# Patient Record
Sex: Female | Born: 1970 | Race: White | Hispanic: No | Marital: Married | Smoking: Current every day smoker
Health system: Southern US, Community
[De-identification: ages and names within clinical notes are randomized; demographics above are authoritative.]

## PROBLEM LIST (undated history)

## (undated) DIAGNOSIS — E663 Overweight: Secondary | ICD-10-CM

## (undated) DIAGNOSIS — F32A Depression, unspecified: Secondary | ICD-10-CM

## (undated) DIAGNOSIS — Z720 Tobacco use: Secondary | ICD-10-CM

## (undated) DIAGNOSIS — R809 Proteinuria, unspecified: Secondary | ICD-10-CM

## (undated) DIAGNOSIS — E041 Nontoxic single thyroid nodule: Secondary | ICD-10-CM

## (undated) DIAGNOSIS — R569 Unspecified convulsions: Secondary | ICD-10-CM

## (undated) DIAGNOSIS — E1140 Type 2 diabetes mellitus with diabetic neuropathy, unspecified: Secondary | ICD-10-CM

## (undated) DIAGNOSIS — I10 Essential (primary) hypertension: Secondary | ICD-10-CM

## (undated) DIAGNOSIS — E785 Hyperlipidemia, unspecified: Secondary | ICD-10-CM

## (undated) DIAGNOSIS — E119 Type 2 diabetes mellitus without complications: Secondary | ICD-10-CM

## (undated) DIAGNOSIS — F329 Major depressive disorder, single episode, unspecified: Secondary | ICD-10-CM

## (undated) DIAGNOSIS — I517 Cardiomegaly: Secondary | ICD-10-CM

## (undated) HISTORY — DX: Major depressive disorder, single episode, unspecified: F32.9

## (undated) HISTORY — PX: TUBAL LIGATION: SHX77

## (undated) HISTORY — DX: Overweight: E66.3

## (undated) HISTORY — DX: Type 2 diabetes mellitus with diabetic neuropathy, unspecified: E11.40

## (undated) HISTORY — DX: Essential (primary) hypertension: I10

## (undated) HISTORY — DX: Hyperlipidemia, unspecified: E78.5

## (undated) HISTORY — DX: Type 2 diabetes mellitus without complications: E11.9

## (undated) HISTORY — DX: Tobacco use: Z72.0

## (undated) HISTORY — DX: Proteinuria, unspecified: R80.9

## (undated) HISTORY — DX: Cardiomegaly: I51.7

## (undated) HISTORY — DX: Nontoxic single thyroid nodule: E04.1

## (undated) HISTORY — DX: Depression, unspecified: F32.A

---

## 2004-10-01 ENCOUNTER — Ambulatory Visit: Payer: Self-pay | Admitting: Psychiatry

## 2004-10-01 ENCOUNTER — Inpatient Hospital Stay (HOSPITAL_COMMUNITY): Admission: RE | Admit: 2004-10-01 | Discharge: 2004-10-06 | Payer: Self-pay | Admitting: Psychiatry

## 2004-12-09 HISTORY — PX: CARDIAC CATHETERIZATION: SHX172

## 2005-05-27 ENCOUNTER — Ambulatory Visit: Payer: Self-pay

## 2005-06-28 ENCOUNTER — Ambulatory Visit: Payer: Self-pay | Admitting: Internal Medicine

## 2006-03-27 ENCOUNTER — Encounter: Payer: Self-pay | Admitting: Rheumatology

## 2006-04-08 ENCOUNTER — Encounter: Payer: Self-pay | Admitting: Rheumatology

## 2006-05-09 ENCOUNTER — Encounter: Payer: Self-pay | Admitting: Rheumatology

## 2007-01-13 ENCOUNTER — Emergency Department: Payer: Self-pay | Admitting: Emergency Medicine

## 2008-12-01 ENCOUNTER — Inpatient Hospital Stay: Payer: Self-pay | Admitting: Internal Medicine

## 2011-04-02 ENCOUNTER — Ambulatory Visit: Payer: Self-pay | Admitting: Nephrology

## 2011-04-02 ENCOUNTER — Emergency Department: Payer: Self-pay | Admitting: Emergency Medicine

## 2011-12-20 ENCOUNTER — Ambulatory Visit: Payer: Self-pay | Admitting: Family Medicine

## 2012-04-02 ENCOUNTER — Emergency Department: Payer: Self-pay | Admitting: Emergency Medicine

## 2012-04-02 LAB — URINALYSIS, COMPLETE
Bacteria: NONE SEEN
Leukocyte Esterase: NEGATIVE
Nitrite: NEGATIVE
Ph: 5 (ref 4.5–8.0)
Protein: NEGATIVE

## 2012-04-02 LAB — CBC
HCT: 43.2 % (ref 35.0–47.0)
HGB: 13.8 g/dL (ref 12.0–16.0)
MCH: 29.5 pg (ref 26.0–34.0)
MCHC: 31.8 g/dL — ABNORMAL LOW (ref 32.0–36.0)
MCV: 93 fL (ref 80–100)
RBC: 4.66 10*6/uL (ref 3.80–5.20)

## 2012-04-02 LAB — COMPREHENSIVE METABOLIC PANEL
Alkaline Phosphatase: 109 U/L (ref 50–136)
Anion Gap: 13 (ref 7–16)
BUN: 17 mg/dL (ref 7–18)
Bilirubin,Total: 0.5 mg/dL (ref 0.2–1.0)
Calcium, Total: 8.6 mg/dL (ref 8.5–10.1)
Chloride: 101 mmol/L (ref 98–107)
Co2: 18 mmol/L — ABNORMAL LOW (ref 21–32)
Creatinine: 1.08 mg/dL (ref 0.60–1.30)
EGFR (Non-African Amer.): >60
Potassium: 3.5 mmol/L (ref 3.5–5.1)
SGOT(AST): 17 U/L (ref 15–37)
SGPT (ALT): 21 U/L
Sodium: 132 mmol/L — ABNORMAL LOW (ref 136–145)
Total Protein: 7.4 g/dL (ref 6.4–8.2)

## 2012-04-02 LAB — TROPONIN I: Troponin-I: <0.02 ng/mL

## 2012-04-02 LAB — PREGNANCY, URINE: Pregnancy Test, Urine: NEGATIVE m[IU]/mL

## 2012-09-08 ENCOUNTER — Ambulatory Visit: Payer: Self-pay

## 2013-05-17 IMAGING — CR DG ABDOMEN 3V
1 series · 4 of 4 positions shown · non-contrast
Comparison: none

REASON FOR EXAM: vomiting
COMMENTS:

PROCEDURE:     DXR - DXR ABDOMEN 3-WAY (INCL PA CXR)  - April 02, 2012  [DATE]
RESULT:     Comparison: Chest radiograph 04/02/2011

[Series 1: pa · 0.17mm/px · 4 of 4 slices shown]
[im 1/4]
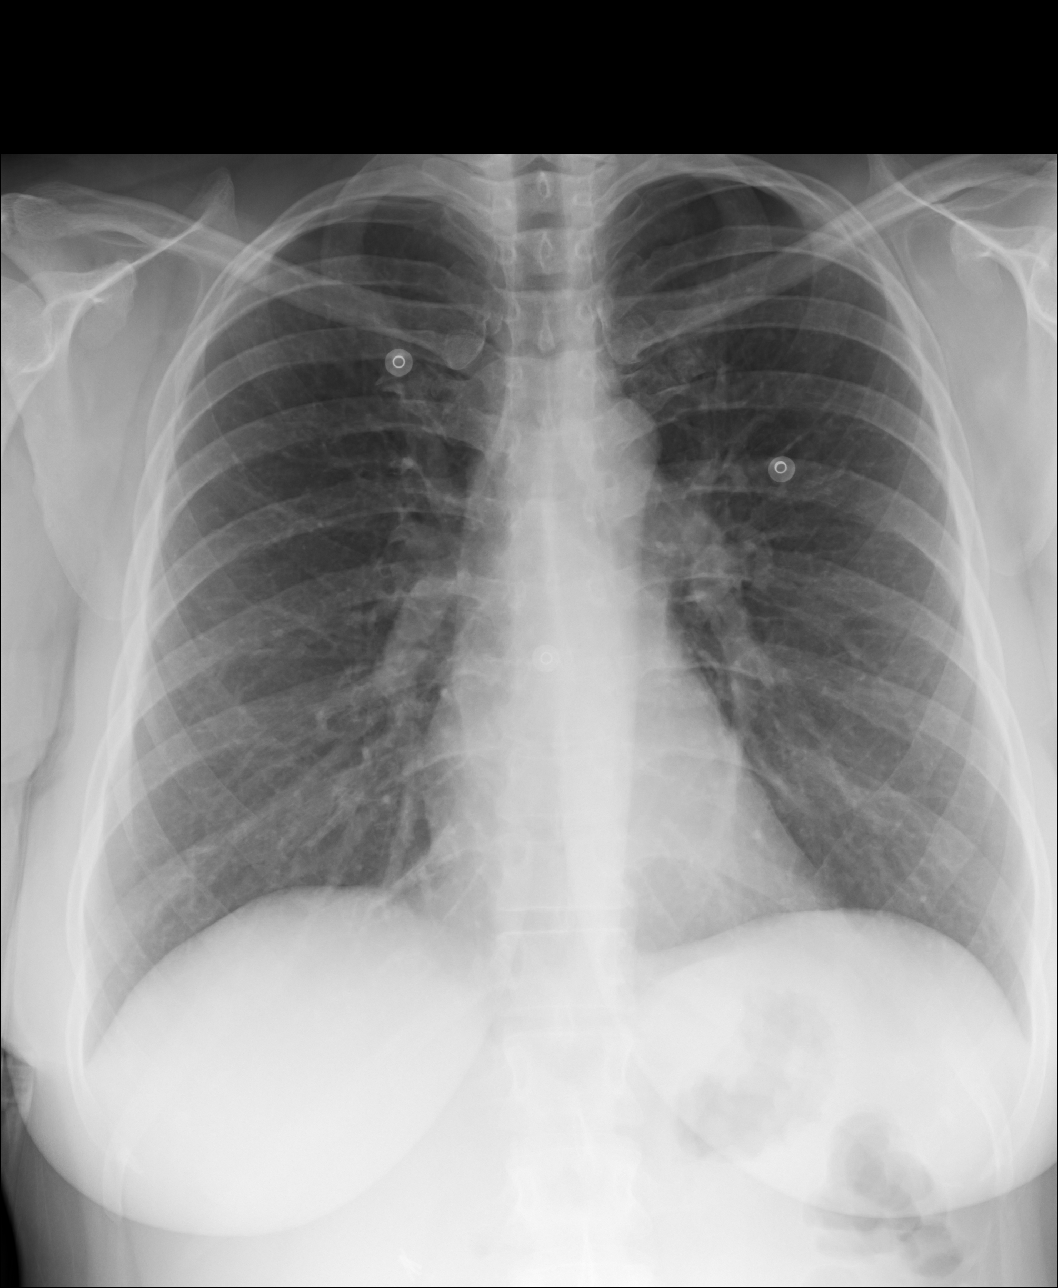
[im 2/4]
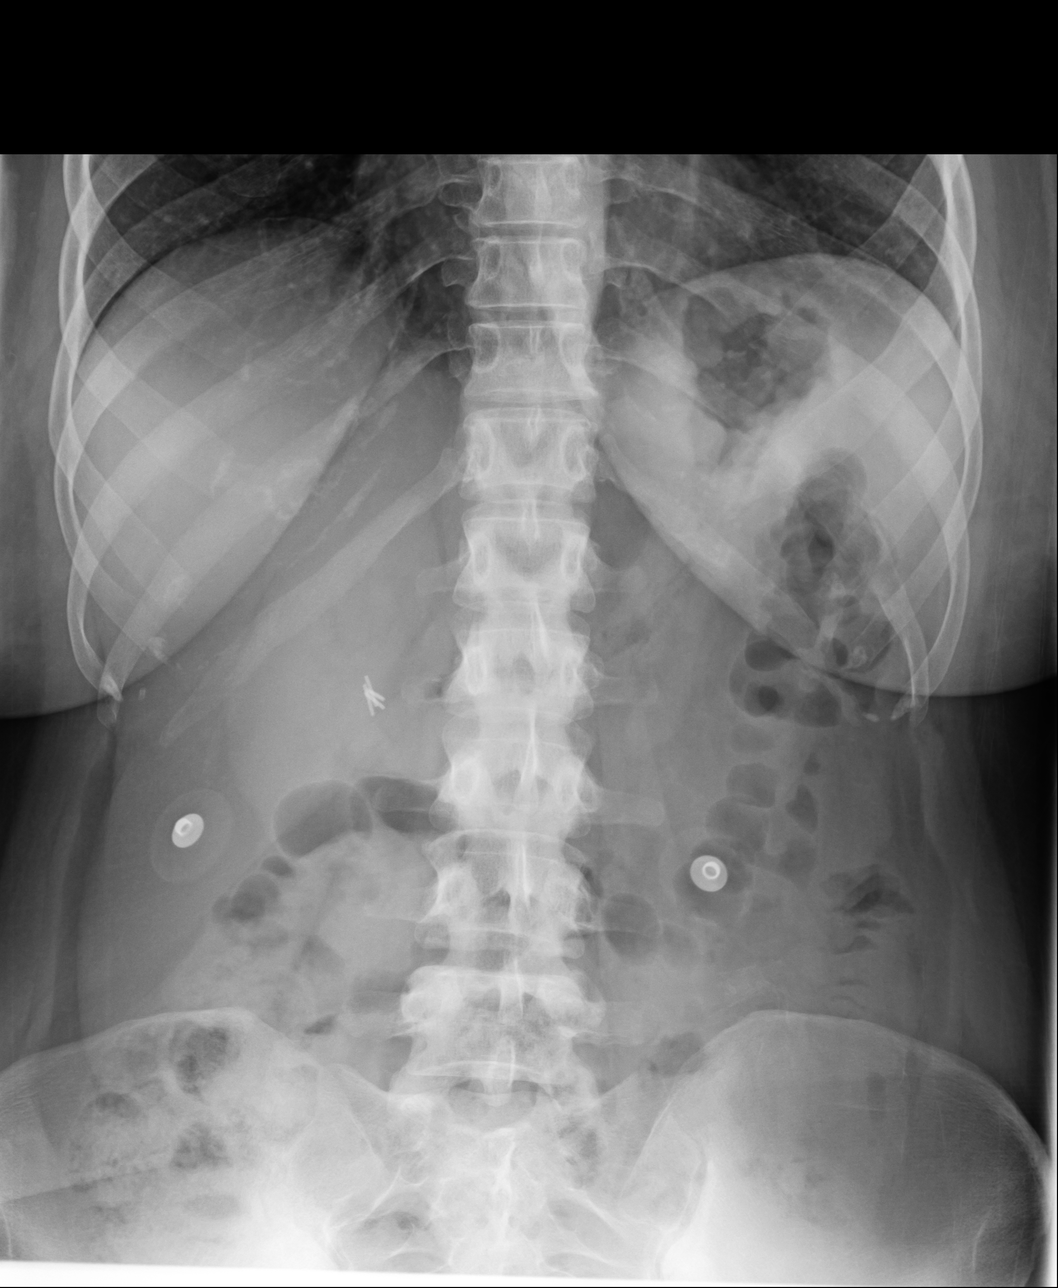
[im 3/4]
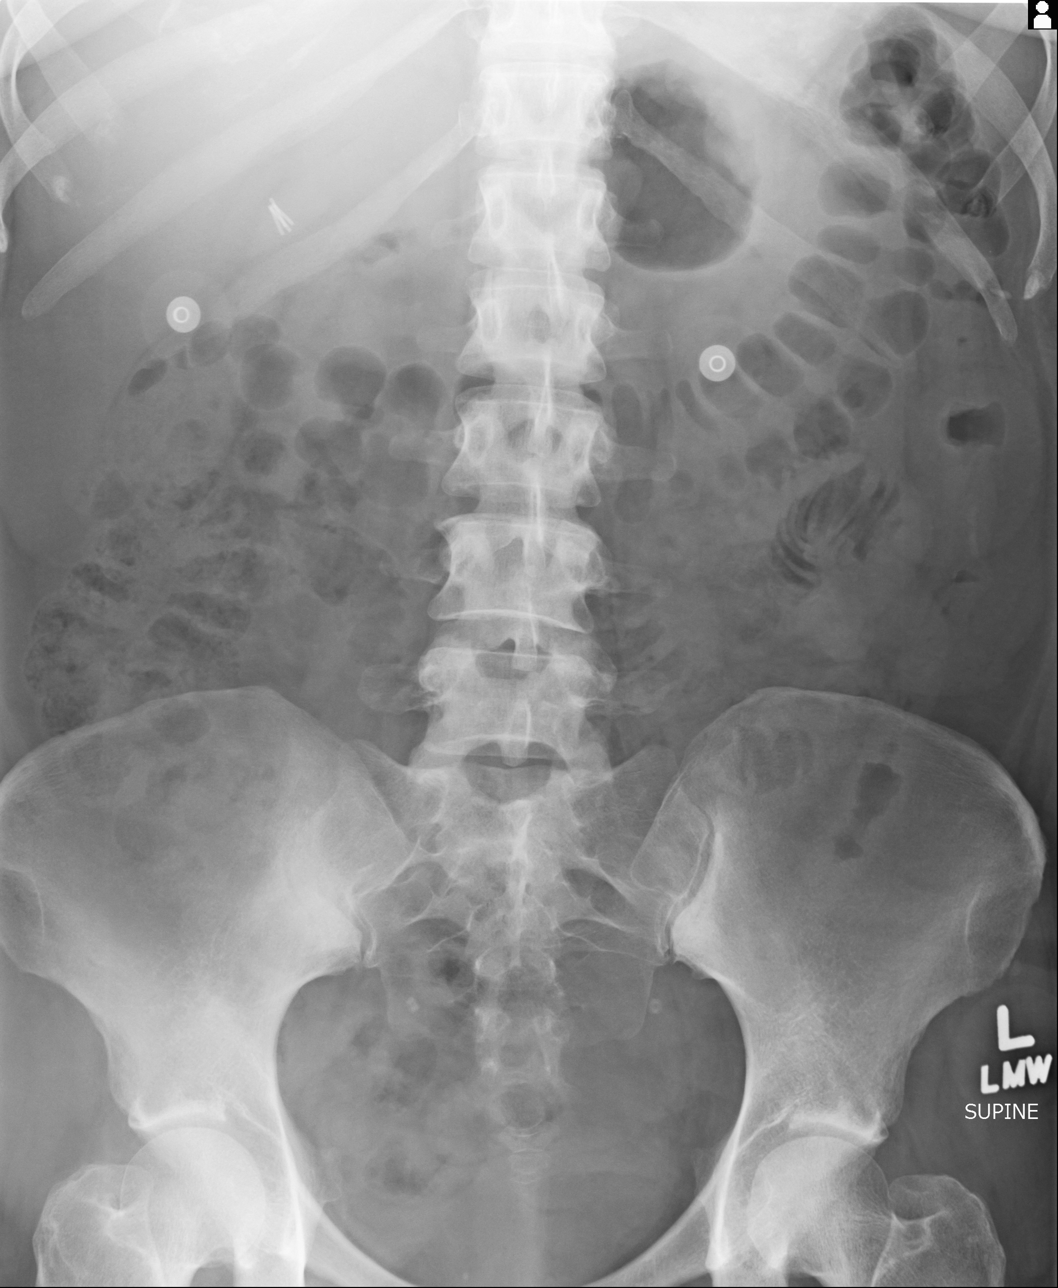
[im 4/4]
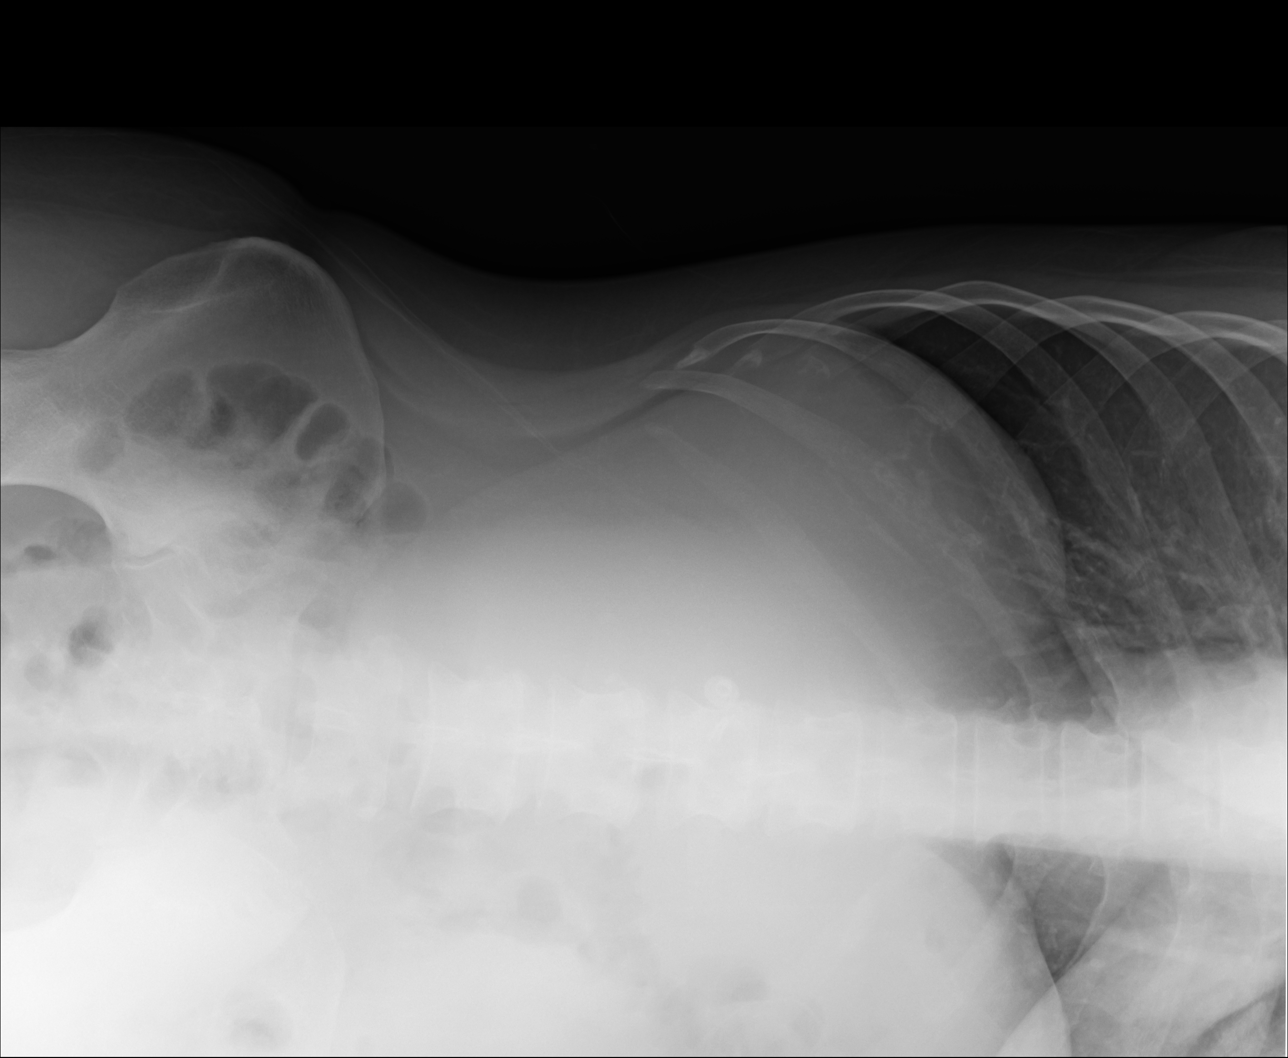

[4 of 4 positions shown; findings below may reference images not displayed]

FINDINGS: The heart and mediastinum are stable. No focal pulmonary opacities.

No free intraperitoneal air. Air is seen within nondilated small and large
bowel. Surgical clips are seen from prior cholecystectomy.
IMPRESSION: Nonobstructed bowel gas pattern.

## 2015-07-20 ENCOUNTER — Other Ambulatory Visit: Payer: Self-pay | Admitting: Family Medicine

## 2015-07-20 NOTE — Telephone Encounter (Signed)
Pt stated she needs the following:  Metformin  Pharm is [facility] on [address] in [place].  Thanks.

## 2015-07-20 NOTE — Telephone Encounter (Signed)
Routing confirmation to provider. Only needs Metformin refilled.

## 2015-07-20 NOTE — Telephone Encounter (Signed)
I attempted to call patient to see which medication(s) she needed refilled. Left a message to call. But looking at her medications, it looks like Metformin is the only one that does not have refills.

## 2015-07-20 NOTE — Telephone Encounter (Signed)
Pt called in to get refill on medication and schedule appt since it had been a while. She would like to have her diabetes medication refilled because she is completely out(send to [facility] [address]). She has scheduled an appt to come in aug 17th.

## 2015-07-21 NOTE — Telephone Encounter (Signed)
Called and left message on patients cell phone number of Dr.Ladas response.

## 2015-07-21 NOTE — Telephone Encounter (Signed)
Patient needs to get this from Dr. Tedd Sias, her diabetes specialist

## 2015-07-25 DIAGNOSIS — F32A Depression, unspecified: Secondary | ICD-10-CM | POA: Insufficient documentation

## 2015-07-25 DIAGNOSIS — I10 Essential (primary) hypertension: Secondary | ICD-10-CM | POA: Insufficient documentation

## 2015-07-25 DIAGNOSIS — E041 Nontoxic single thyroid nodule: Secondary | ICD-10-CM | POA: Insufficient documentation

## 2015-07-25 DIAGNOSIS — E119 Type 2 diabetes mellitus without complications: Secondary | ICD-10-CM | POA: Insufficient documentation

## 2015-07-25 DIAGNOSIS — R809 Proteinuria, unspecified: Secondary | ICD-10-CM | POA: Insufficient documentation

## 2015-07-25 DIAGNOSIS — Z720 Tobacco use: Secondary | ICD-10-CM | POA: Insufficient documentation

## 2015-07-25 DIAGNOSIS — I517 Cardiomegaly: Secondary | ICD-10-CM | POA: Insufficient documentation

## 2015-07-25 DIAGNOSIS — E663 Overweight: Secondary | ICD-10-CM | POA: Insufficient documentation

## 2015-07-25 DIAGNOSIS — F329 Major depressive disorder, single episode, unspecified: Secondary | ICD-10-CM | POA: Insufficient documentation

## 2015-07-25 DIAGNOSIS — E785 Hyperlipidemia, unspecified: Secondary | ICD-10-CM | POA: Insufficient documentation

## 2015-07-26 ENCOUNTER — Encounter: Payer: Self-pay | Admitting: Family Medicine

## 2015-07-26 ENCOUNTER — Ambulatory Visit (INDEPENDENT_AMBULATORY_CARE_PROVIDER_SITE_OTHER): Payer: No Typology Code available for payment source | Admitting: Family Medicine

## 2015-07-26 VITALS — BP 110/74 | HR 104 | Temp 99.0°F | Ht 66.0 in | Wt 180.0 lb

## 2015-07-26 DIAGNOSIS — E119 Type 2 diabetes mellitus without complications: Secondary | ICD-10-CM

## 2015-07-26 DIAGNOSIS — I10 Essential (primary) hypertension: Secondary | ICD-10-CM

## 2015-07-26 DIAGNOSIS — E785 Hyperlipidemia, unspecified: Secondary | ICD-10-CM | POA: Diagnosis not present

## 2015-07-26 DIAGNOSIS — Z720 Tobacco use: Secondary | ICD-10-CM

## 2015-07-26 DIAGNOSIS — Z5181 Encounter for therapeutic drug level monitoring: Secondary | ICD-10-CM | POA: Diagnosis not present

## 2015-07-26 MED ORDER — ATORVASTATIN CALCIUM 40 MG PO TABS: 40.0000 mg | ORAL_TABLET | Freq: Every day | ORAL | Status: DC

## 2015-07-26 NOTE — Patient Instructions (Signed)
Stop the Crestor Start the atorvastatin (generic Lipitor) Have labs done tomorrow Recheck lipid panel (cholesterol) and liver enzyme in 2-3 months Limit saturated fats I'll recommend flu shots every fall

## 2015-07-26 NOTE — Assessment & Plan Note (Signed)
Check renal and hepatic function 

## 2015-07-26 NOTE — Progress Notes (Signed)
BP 110/74 mmHg  Pulse 104  Temp(Src) 99 F (37.2 C)  Ht 5\' 6"  (1.676 m)  Wt 180 lb (81.647 kg)  BMI 29.07 kg/m2  SpO2 98%   Subjective:    Patient ID: Elizabeth Dorsey, female    DOB: [birth year], 44 y.o.   MRN: [id number]  HPI: Elizabeth Dorsey is a 44 y.o. female  Chief Complaint  Patient presents with  . Hyperlipidemia    needs Crestor changed, insurance no longer covers it  . Hypertension   She was on Crestor a while back; the insurance company stopped taking it; needs something else; she has never tried anything else She gets labs done tomorrow for her diabetes She never had any muscle aches or abd pain It does run in the family She does not eat many eggs; skips breakfast usually; lunch is usually chef salad or burger or hot dog; might get hamburger steak and veggies; not much bread; some cheese Does get a good amount of fiber and fruits and veggies  She sees Dr. Evelene Croon for mood, depression, under good control with current meds  Diabetes, sees Dr. Tedd Sias next week (goes tomorrow for labs)  Relevant past medical, surgical, family and social history reviewed and updated as indicated. Interim medical history since our last visit reviewed. Allergies and medications reviewed and updated.  Review of Systems  Per HPI unless specifically indicated above     Objective:    BP 110/74 mmHg  Pulse 104  Temp(Src) 99 F (37.2 C)  Ht 5\' 6"  (1.676 m)  Wt 180 lb (81.647 kg)  BMI 29.07 kg/m2  SpO2 98%  Wt Readings from Last 3 Encounters:  07/26/15 180 lb (81.647 kg)  08/11/14 181 lb (82.101 kg)  08/11/14 181 lb (82.101 kg)    Physical Exam  Constitutional: She appears well-developed and well-nourished. No distress.  HENT:  Head: Normocephalic and atraumatic.  Eyes: EOM are normal. No scleral icterus.  Neck: No thyromegaly present.  Cardiovascular: Normal rate, regular rhythm and normal heart sounds.   No murmur heard. Pulmonary/Chest: Effort normal and  breath sounds normal. No respiratory distress. She has no wheezes.  Abdominal: Soft. Bowel sounds are normal. She exhibits no distension.  Musculoskeletal: Normal range of motion. She exhibits no edema.  Neurological: She is alert. She exhibits normal muscle tone.  Skin: Skin is warm and dry. She is not diaphoretic. No pallor.  Psychiatric: She has a normal mood and affect. Her behavior is normal. Judgment and thought content normal.   Diabetic Foot Form - Detailed   Diabetic Foot Exam - detailed  Visual Foot Exam completed.:  Yes  Are the toenails long?:  No  Are the toenails thick?:  No  Are the toenails ingrown?:  No    Pulse Foot Exam completed.:  Yes  Right Dorsalis Pedis:  Present Left Dorsalis Pedis:  Present  Sensory Foot Exam Completed.:  Yes  Semmes-Weinstein Monofilament Test  R Site 1-Great Toe:  Pos L Site 1-Great Toe:  Pos  R Site 4:  Pos L Site 4:  Pos          Assessment & Plan:   Problem List Items Addressed This Visit      Cardiovascular and Mediastinum   Hypertension    Well-controlled; continue ACE-inhibitor      Relevant Medications   atorvastatin (LIPITOR) 40 MG tablet     Endocrine   Diabetes mellitus without complication    She sees endocrinologist for this; foot exam  by MD (me) today      Relevant Medications   atorvastatin (LIPITOR) 40 MG tablet     Other   Dyslipidemia - Primary    Check lipid panel and switch statins; she will have labs drawn through endo office and sent here; limit eggs, saturated fats like those found in bacon, sausage, etc.; goal LDL less than 100 at least; goal HDL over 50; goal TG less than 150      Relevant Medications   atorvastatin (LIPITOR) 40 MG tablet   Other Relevant Orders   Lipid Panel w/o Chol/HDL Ratio (Completed)   Tobacco abuse    She is not ready to quit smoking right now; I am here to help if/when that day comes      Medication monitoring encounter    Check renal and hepatic function       Relevant Orders   Comprehensive metabolic panel      Follow up plan: Return in about 6 months (around 01/26/2016) for high cholesterol; return in 2-3 months JUST for fasting labs.  An after-visit summary was printed and given to the patient at check-out.  Please see the patient instructions which may contain other information and recommendations beyond what is mentioned above in the assessment and plan.  Orders Placed This Encounter  Procedures  . Lipid Panel w/o Chol/HDL Ratio  . Comprehensive metabolic panel

## 2015-07-26 NOTE — Assessment & Plan Note (Addendum)
Check lipid panel and switch statins; she will have labs drawn through endo office and sent here; limit eggs, saturated fats like those found in bacon, sausage, etc.; goal LDL less than 100 at least; goal HDL over 50; goal TG less than 150

## 2015-07-28 ENCOUNTER — Encounter: Payer: Self-pay | Admitting: Family Medicine

## 2015-07-28 NOTE — Assessment & Plan Note (Signed)
She sees endocrinologist for this; foot exam by MD (me) today

## 2015-07-28 NOTE — Assessment & Plan Note (Signed)
She is not ready to quit smoking right now; I am here to help if/when that day comes

## 2015-07-28 NOTE — Assessment & Plan Note (Signed)
Well-controlled; continue ACE-inhibitor

## 2015-08-10 ENCOUNTER — Telehealth: Payer: Self-pay | Admitting: Family Medicine

## 2015-08-10 NOTE — Telephone Encounter (Signed)
Left message to call.

## 2015-08-10 NOTE — Telephone Encounter (Signed)
Patient was supposed to have had a CMP done; [facility] only sent me the lipid panel I need a CMP; if they didn't do one, patient needs one done Thank you

## 2015-08-10 NOTE — Telephone Encounter (Signed)
Patient is not sure if they did more than the Lipid panel or not. She is going to call them and ask if they did a CMP and call me back and let me know.

## 2015-09-17 ENCOUNTER — Telehealth: Payer: Self-pay | Admitting: Family Medicine

## 2015-09-17 DIAGNOSIS — Z5181 Encounter for therapeutic drug level monitoring: Secondary | ICD-10-CM

## 2015-09-17 DIAGNOSIS — E785 Hyperlipidemia, unspecified: Secondary | ICD-10-CM

## 2015-09-17 NOTE — Telephone Encounter (Signed)
-----   Message from Kerman Passey, MD sent at 07/28/2015 12:43 PM EDT ----- Regarding: enter lipid and sgpt orders Medicine changed August 18th; due for repeat labs 8-12 weeks later

## 2015-09-17 NOTE — Assessment & Plan Note (Signed)
Recheck lipids on statin 

## 2015-09-17 NOTE — Telephone Encounter (Signed)
Entering orders

## 2015-09-17 NOTE — Assessment & Plan Note (Signed)
Monitor SGPT on statin

## 2015-10-19 ENCOUNTER — Other Ambulatory Visit: Payer: No Typology Code available for payment source

## 2015-10-27 ENCOUNTER — Other Ambulatory Visit: Payer: Self-pay | Admitting: Family Medicine

## 2015-10-27 NOTE — Telephone Encounter (Signed)
Patient notified

## 2015-10-27 NOTE — Telephone Encounter (Signed)
Please remind patient labs are due; I'll just send a week's worth; I don't want to give her a whole month if it is the wrong strength

## 2015-10-27 NOTE — Telephone Encounter (Signed)
Your patient.  Thanks 

## 2015-10-31 ENCOUNTER — Other Ambulatory Visit: Payer: Self-pay | Admitting: Family Medicine

## 2015-10-31 DIAGNOSIS — Z5181 Encounter for therapeutic drug level monitoring: Secondary | ICD-10-CM

## 2015-10-31 DIAGNOSIS — E785 Hyperlipidemia, unspecified: Secondary | ICD-10-CM

## 2015-10-31 NOTE — Telephone Encounter (Signed)
Your patient.  Thanks 

## 2015-10-31 NOTE — Telephone Encounter (Signed)
Please ask patient to have labs done ASAP I'll re-enter lab orders as the others may be confusing (one says collected, others say future, so I'll start from scratch) Use orders from today; my other lipid, sgpt, and cmp can be cancelled She still needs to get her A1C and other diabetes labs through her endocrinologist

## 2015-10-31 NOTE — Assessment & Plan Note (Signed)
Check liver function and creatinine and K+

## 2015-10-31 NOTE — Assessment & Plan Note (Signed)
Check fasting lipids; new orders entered

## 2015-10-31 NOTE — Telephone Encounter (Signed)
Called patient no answer. Left message to please return my phone call.

## 2015-11-01 ENCOUNTER — Other Ambulatory Visit: Payer: Self-pay

## 2015-11-01 NOTE — Telephone Encounter (Signed)
Lab appointment set for Tuesday the 29th at 9:00am

## 2015-11-06 ENCOUNTER — Other Ambulatory Visit: Payer: Self-pay

## 2015-11-07 ENCOUNTER — Other Ambulatory Visit: Payer: No Typology Code available for payment source

## 2015-11-07 DIAGNOSIS — E785 Hyperlipidemia, unspecified: Secondary | ICD-10-CM

## 2015-11-07 DIAGNOSIS — Z5181 Encounter for therapeutic drug level monitoring: Secondary | ICD-10-CM

## 2015-11-08 ENCOUNTER — Other Ambulatory Visit: Payer: Self-pay | Admitting: Family Medicine

## 2015-11-08 ENCOUNTER — Encounter: Payer: Self-pay | Admitting: Family Medicine

## 2015-11-08 LAB — COMPREHENSIVE METABOLIC PANEL
ALT: 18 IU/L (ref 0–32)
AST: 17 IU/L (ref 0–40)
Albumin/Globulin Ratio: 2 (ref 1.1–2.5)
Albumin: 4.3 g/dL (ref 3.5–5.5)
Alkaline Phosphatase: 110 IU/L (ref 39–117)
BUN/Creatinine Ratio: 16 (ref 9–23)
BUN: 12 mg/dL (ref 6–24)
Bilirubin Total: 0.2 mg/dL (ref 0.0–1.2)
CALCIUM: 9.4 mg/dL (ref 8.7–10.2)
CO2: 21 mmol/L (ref 18–29)
CREATININE: 0.73 mg/dL (ref 0.57–1.00)
Chloride: 101 mmol/L (ref 97–106)
GFR, EST AFRICAN AMERICAN: 116 mL/min/{1.73_m2} (ref >59)
GFR, EST NON AFRICAN AMERICAN: 100 mL/min/{1.73_m2} (ref >59)
GLUCOSE: 327 mg/dL — AB (ref 65–99)
Globulin, Total: 2.2 g/dL (ref 1.5–4.5)
Potassium: 4.8 mmol/L (ref 3.5–5.2)
Sodium: 138 mmol/L (ref 136–144)
TOTAL PROTEIN: 6.5 g/dL (ref 6.0–8.5)

## 2015-11-08 LAB — LIPID PANEL W/O CHOL/HDL RATIO
Cholesterol, Total: 171 mg/dL (ref 100–199)
HDL: 35 mg/dL — AB (ref >39)
LDL Calculated: 87 mg/dL (ref 0–99)
TRIGLYCERIDES: 247 mg/dL — AB (ref 0–149)
VLDL CHOLESTEROL CAL: 49 mg/dL — AB (ref 5–40)

## 2015-11-08 MED ORDER — ATORVASTATIN CALCIUM 40 MG PO TABS: 40.0000 mg | ORAL_TABLET | Freq: Every day | ORAL | Status: DC

## 2015-11-26 ENCOUNTER — Other Ambulatory Visit: Payer: Self-pay | Admitting: Family Medicine

## 2015-11-27 NOTE — Telephone Encounter (Signed)
RX was written 11/08/15 with 6 refills.

## 2016-01-26 ENCOUNTER — Ambulatory Visit: Payer: No Typology Code available for payment source | Admitting: Family Medicine

## 2016-02-26 ENCOUNTER — Ambulatory Visit: Payer: No Typology Code available for payment source | Admitting: Unknown Physician Specialty

## 2016-07-15 ENCOUNTER — Other Ambulatory Visit: Payer: Self-pay

## 2016-07-16 ENCOUNTER — Other Ambulatory Visit: Payer: Self-pay

## 2016-09-16 ENCOUNTER — Ambulatory Visit (INDEPENDENT_AMBULATORY_CARE_PROVIDER_SITE_OTHER): Payer: BLUE CROSS/BLUE SHIELD | Admitting: Unknown Physician Specialty

## 2016-09-16 ENCOUNTER — Encounter: Payer: Self-pay | Admitting: Unknown Physician Specialty

## 2016-09-16 VITALS — BP 114/79 | HR 103 | Temp 98.4°F | Ht 67.0 in | Wt 197.4 lb

## 2016-09-16 DIAGNOSIS — Z23 Encounter for immunization: Secondary | ICD-10-CM | POA: Diagnosis not present

## 2016-09-16 DIAGNOSIS — R300 Dysuria: Secondary | ICD-10-CM

## 2016-09-16 MED ORDER — ATORVASTATIN CALCIUM 40 MG PO TABS: 40.0000 mg | ORAL_TABLET | Freq: Every day | ORAL | 6 refills | Status: DC

## 2016-09-16 MED ORDER — NITROFURANTOIN MACROCRYSTAL 100 MG PO CAPS: 100.0000 mg | ORAL_CAPSULE | Freq: Two times a day (BID) | ORAL | 0 refills | Status: DC

## 2016-09-16 NOTE — Patient Instructions (Addendum)

## 2016-09-16 NOTE — Progress Notes (Signed)
BP 114/79 (BP Location: Left Arm, Patient Position: Sitting, Cuff Size: Large)   Pulse (!) 103   Temp 98.4 F (36.9 C)   Ht 5\' 7"  (1.702 m)   Wt 197 lb 6.4 oz (89.5 kg)   SpO2 97%   BMI 30.92 kg/m    Subjective:    Patient ID: Elizabeth Dorsey, female    DOB: 05/28/1971, 45 y.o.   MRN: [id number]  HPI: Elizabeth Dorsey is a 45 y.o. female  Chief Complaint  Patient presents with  . Urinary Tract Infection    pt states she has been having burning with urination and frequent urination since yesterday, states she had a UTI 3 weeks ago and was taking cipro for it    [facility]  States that the abs helped but the symptoms came back.  She has been on Bactrim which wasn't working and then Cipro for 1 week.  She is having burning with urination. She states blood sugars are about 150-200.  She is currently seeing Dr. Tedd SiasSolum and last Hgb A1C was 9.9 and improving.    She has run out of Atorvastatin and would like to restart.   Relevant past medical, surgical, family and social history reviewed and updated as indicated. Interim medical history since our last visit reviewed. Allergies and medications reviewed and updated.  Review of Systems  Per HPI unless specifically indicated above     Objective:    BP 114/79 (BP Location: Left Arm, Patient Position: Sitting, Cuff Size: Large)   Pulse (!) 103   Temp 98.4 F (36.9 C)   Ht 5\' 7"  (1.702 m)   Wt 197 lb 6.4 oz (89.5 kg)   SpO2 97%   BMI 30.92 kg/m   Wt Readings from Last 3 Encounters:  09/16/16 197 lb 6.4 oz (89.5 kg)  07/26/15 180 lb (81.6 kg)  08/11/14 181 lb (82.1 kg)    Physical Exam  Constitutional: She is oriented to person, place, and time. She appears well-developed and well-nourished. No distress.  HENT:  Head: Normocephalic and atraumatic.  Eyes: Conjunctivae and lids are normal. Right eye exhibits no discharge. Left eye exhibits no discharge. No scleral icterus.    Neck: Normal range of motion. Neck supple. No JVD present. Carotid bruit is not present.  Cardiovascular: Normal rate, regular rhythm and normal heart sounds.   Pulmonary/Chest: Effort normal and breath sounds normal.  Abdominal: Soft. Normal appearance. There is no splenomegaly or hepatomegaly. There is no tenderness. There is no rebound and no guarding.  Musculoskeletal: Normal range of motion.  Neurological: She is alert and oriented to person, place, and time.  Skin: Skin is warm, dry and intact. No rash noted. No pallor.  Psychiatric: She has a normal mood and affect. Her behavior is normal. Judgment and thought content normal.   Urine is nonspecific Assessment & Plan:   Problem List Items Addressed This Visit    None    Visit Diagnoses    Need for influenza vaccination    -  Primary   Relevant Orders   Flu Vaccine QUAD 36+ mos IM   Burning with urination       urine is non-specific and will treat with Mactrodantin.  Will f/u up with culture.  If culture negative, refer to Urology for this and hematuria   Relevant Orders   UA/M w/rflx Culture, Routine       Follow up plan: Return for Needs  visit for f/u chronic ilnesses.

## 2016-09-19 LAB — MICROSCOPIC EXAMINATION

## 2016-09-19 LAB — UA/M W/RFLX CULTURE, ROUTINE
PH UA: 5 (ref 5.0–7.5)
Specific Gravity, UA: 1.02 (ref 1.005–1.030)

## 2016-09-19 LAB — URINE CULTURE, REFLEX

## 2016-09-23 ENCOUNTER — Telehealth: Payer: Self-pay | Admitting: Unknown Physician Specialty

## 2016-09-23 MED ORDER — CIPROFLOXACIN HCL 250 MG PO TABS: 250.0000 mg | ORAL_TABLET | Freq: Two times a day (BID) | ORAL | 0 refills | Status: DC

## 2016-09-23 NOTE — Telephone Encounter (Signed)
answer

## 2016-09-23 NOTE — Telephone Encounter (Signed)
Discussed urine culture results.  Add Cipre

## 2016-12-11 ENCOUNTER — Other Ambulatory Visit: Payer: Self-pay | Admitting: Unknown Physician Specialty

## 2016-12-11 DIAGNOSIS — Z1231 Encounter for screening mammogram for malignant neoplasm of breast: Secondary | ICD-10-CM

## 2017-01-09 ENCOUNTER — Ambulatory Visit
Admission: RE | Admit: 2017-01-09 | Discharge: 2017-01-09 | Disposition: A | Discharge status: Home / Self Care | Payer: BLUE CROSS/BLUE SHIELD | Source: Ambulatory Visit | Attending: Unknown Physician Specialty | Admitting: Unknown Physician Specialty

## 2017-01-09 DIAGNOSIS — Z1231 Encounter for screening mammogram for malignant neoplasm of breast: Secondary | ICD-10-CM | POA: Insufficient documentation

## 2017-04-14 ENCOUNTER — Telehealth: Payer: Self-pay | Admitting: Unknown Physician Specialty

## 2017-04-15 NOTE — Telephone Encounter (Signed)
Called patient because she was due for A1C and foot exam. Elizabeth Dorsey spoke with patient and she is being [facility].

## 2017-04-16 ENCOUNTER — Other Ambulatory Visit: Payer: Self-pay | Admitting: Unknown Physician Specialty

## 2017-04-21 ENCOUNTER — Other Ambulatory Visit: Payer: Self-pay | Admitting: Unknown Physician Specialty

## 2017-05-08 ENCOUNTER — Other Ambulatory Visit: Payer: Self-pay | Admitting: Unknown Physician Specialty

## 2017-09-02 ENCOUNTER — Other Ambulatory Visit: Payer: Self-pay | Admitting: Unknown Physician Specialty

## 2017-11-27 ENCOUNTER — Other Ambulatory Visit: Payer: Self-pay | Admitting: Unknown Physician Specialty

## 2017-11-28 NOTE — Telephone Encounter (Signed)
Contacted pt regarding refill request; last visit 09/16/16 with Gabriel Cirriheryl Wicker; pt states that she gets her new insurance in January, and she will h ave her new work schedule; she will call back to make an appointment.

## 2018-11-11 ENCOUNTER — Other Ambulatory Visit: Payer: Self-pay | Admitting: Internal Medicine

## 2018-11-25 ENCOUNTER — Other Ambulatory Visit: Payer: Self-pay | Admitting: Obstetrics and Gynecology

## 2018-11-25 DIAGNOSIS — Z1231 Encounter for screening mammogram for malignant neoplasm of breast: Secondary | ICD-10-CM

## 2019-01-21 ENCOUNTER — Other Ambulatory Visit: Payer: Self-pay | Admitting: Family Medicine

## 2019-01-21 DIAGNOSIS — Z1231 Encounter for screening mammogram for malignant neoplasm of breast: Secondary | ICD-10-CM

## 2019-12-14 LAB — HEMOGLOBIN A1C: Hemoglobin A1C: 11

## 2019-12-31 ENCOUNTER — Other Ambulatory Visit: Payer: Self-pay

## 2019-12-31 ENCOUNTER — Ambulatory Visit
Admission: RE | Admit: 2019-12-31 | Discharge: 2019-12-31 | Disposition: A | Discharge status: Home / Self Care | Payer: 59 | Source: Ambulatory Visit | Attending: Family Medicine | Admitting: Family Medicine

## 2019-12-31 DIAGNOSIS — Z1231 Encounter for screening mammogram for malignant neoplasm of breast: Secondary | ICD-10-CM | POA: Diagnosis not present

## 2020-03-10 ENCOUNTER — Other Ambulatory Visit: Payer: Self-pay

## 2020-03-10 ENCOUNTER — Ambulatory Visit: Payer: 59 | Attending: Internal Medicine

## 2020-03-10 DIAGNOSIS — Z23 Encounter for immunization: Secondary | ICD-10-CM

## 2020-03-10 NOTE — Progress Notes (Signed)
   Covid-19 Vaccination Clinic  Name:  Macie Baum    MRN: [id number] DOB: 04-04-1971  03/10/2020  Ms. Kras was observed post Covid-19 immunization for 15 minutes without incident. She was provided with Vaccine Information Sheet and instruction to access the V-Safe system.   Ms. Hofacker was instructed to call 911 with any severe reactions post vaccine: Marland Kitchen Difficulty breathing  . Swelling of face and throat  . A fast heartbeat  . A bad rash all over body  . Dizziness and weakness   Immunizations Administered    Name Date Dose VIS Date Route   Pfizer COVID-19 Vaccine 03/10/2020 11:31 AM 0.3 mL 11/19/2019 Intramuscular   Manufacturer: [employer], [employer]   Lot: [id number]   NDC: [address]-4

## 2020-04-05 ENCOUNTER — Ambulatory Visit: Payer: 59

## 2020-04-07 ENCOUNTER — Ambulatory Visit: Payer: 59 | Attending: Internal Medicine

## 2020-04-07 DIAGNOSIS — Z23 Encounter for immunization: Secondary | ICD-10-CM

## 2020-04-07 NOTE — Progress Notes (Signed)
   Covid-19 Vaccination Clinic  Name:  Elizabeth Dorsey    MRN: [id number] DOB: [birth year]  04/07/2020  Elizabeth Dorsey was observed post Covid-19 immunization for 15 minutes without incident. She was provided with Vaccine Information Sheet and instruction to access the V-Safe system.   Elizabeth Dorsey was instructed to call 911 with any severe reactions post vaccine: Marland Kitchen Difficulty breathing  . Swelling of face and throat  . A fast heartbeat  . A bad rash all over body  . Dizziness and weakness   Immunizations Administered    Name Date Dose VIS Date Route   Pfizer COVID-19 Vaccine 04/07/2020  8:54 AM 0.3 mL 02/02/2019 Intramuscular   Manufacturer: [employer], [employer]   Lot: [id number]   NDC: [address]-9

## 2020-04-24 ENCOUNTER — Other Ambulatory Visit: Payer: Self-pay | Admitting: Nurse Practitioner

## 2021-01-25 ENCOUNTER — Other Ambulatory Visit: Payer: Self-pay | Admitting: Internal Medicine

## 2021-01-25 DIAGNOSIS — Z1231 Encounter for screening mammogram for malignant neoplasm of breast: Secondary | ICD-10-CM

## 2021-01-31 ENCOUNTER — Encounter: Payer: Self-pay | Admitting: Emergency Medicine

## 2021-01-31 ENCOUNTER — Other Ambulatory Visit: Payer: Self-pay

## 2021-01-31 ENCOUNTER — Emergency Department
Admission: EM | Admit: 2021-01-31 | Discharge: 2021-01-31 | Disposition: A | Discharge status: Home / Self Care | Payer: 59 | Attending: Emergency Medicine | Admitting: Emergency Medicine

## 2021-01-31 DIAGNOSIS — E1140 Type 2 diabetes mellitus with diabetic neuropathy, unspecified: Secondary | ICD-10-CM | POA: Insufficient documentation

## 2021-01-31 DIAGNOSIS — F17210 Nicotine dependence, cigarettes, uncomplicated: Secondary | ICD-10-CM | POA: Insufficient documentation

## 2021-01-31 DIAGNOSIS — Z7984 Long term (current) use of oral hypoglycemic drugs: Secondary | ICD-10-CM | POA: Diagnosis not present

## 2021-01-31 DIAGNOSIS — Z794 Long term (current) use of insulin: Secondary | ICD-10-CM | POA: Insufficient documentation

## 2021-01-31 DIAGNOSIS — Z7982 Long term (current) use of aspirin: Secondary | ICD-10-CM | POA: Insufficient documentation

## 2021-01-31 DIAGNOSIS — Z20822 Contact with and (suspected) exposure to covid-19: Secondary | ICD-10-CM | POA: Insufficient documentation

## 2021-01-31 DIAGNOSIS — Z79899 Other long term (current) drug therapy: Secondary | ICD-10-CM | POA: Insufficient documentation

## 2021-01-31 DIAGNOSIS — R197 Diarrhea, unspecified: Secondary | ICD-10-CM | POA: Diagnosis not present

## 2021-01-31 DIAGNOSIS — R42 Dizziness and giddiness: Secondary | ICD-10-CM | POA: Insufficient documentation

## 2021-01-31 DIAGNOSIS — R61 Generalized hyperhidrosis: Secondary | ICD-10-CM | POA: Insufficient documentation

## 2021-01-31 DIAGNOSIS — R112 Nausea with vomiting, unspecified: Secondary | ICD-10-CM | POA: Diagnosis not present

## 2021-01-31 DIAGNOSIS — I10 Essential (primary) hypertension: Secondary | ICD-10-CM | POA: Insufficient documentation

## 2021-01-31 LAB — HEPATIC FUNCTION PANEL
ALT: 26 U/L (ref 0–44)
AST: 36 U/L (ref 15–41)
Albumin: 4.4 g/dL (ref 3.5–5.0)
Alkaline Phosphatase: 110 U/L (ref 38–126)
Bilirubin, Direct: <0.1 mg/dL (ref 0.0–0.2)
Total Bilirubin: 0.6 mg/dL (ref 0.3–1.2)
Total Protein: 7.7 g/dL (ref 6.5–8.1)

## 2021-01-31 LAB — BASIC METABOLIC PANEL
Anion gap: 16 — ABNORMAL HIGH (ref 5–15)
BUN: 32 mg/dL — ABNORMAL HIGH (ref 6–20)
CO2: 23 mmol/L (ref 22–32)
Calcium: 10.2 mg/dL (ref 8.9–10.3)
Chloride: 96 mmol/L — ABNORMAL LOW (ref 98–111)
Creatinine, Ser: 1.56 mg/dL — ABNORMAL HIGH (ref 0.44–1.00)
GFR, Estimated: 40 mL/min — ABNORMAL LOW (ref >60)
Glucose, Bld: 74 mg/dL (ref 70–99)
Potassium: 3.4 mmol/L — ABNORMAL LOW (ref 3.5–5.1)
Sodium: 135 mmol/L (ref 135–145)

## 2021-01-31 LAB — CBC
HCT: 41.5 % (ref 36.0–46.0)
Hemoglobin: 14.1 g/dL (ref 12.0–15.0)
MCH: 30.4 pg (ref 26.0–34.0)
MCHC: 34 g/dL (ref 30.0–36.0)
MCV: 89.4 fL (ref 80.0–100.0)
Platelets: 516 10*3/uL — ABNORMAL HIGH (ref 150–400)
RBC: 4.64 MIL/uL (ref 3.87–5.11)
RDW: 13.1 % (ref 11.5–15.5)
WBC: 13.4 10*3/uL — ABNORMAL HIGH (ref 4.0–10.5)
nRBC: 0 % (ref 0.0–0.2)

## 2021-01-31 LAB — CBG MONITORING, ED
Glucose-Capillary: 78 mg/dL (ref 70–99)
Glucose-Capillary: 96 mg/dL (ref 70–99)
Glucose-Capillary: 39 mg/dL — CL (ref 70–99)
Glucose-Capillary: 103 mg/dL — ABNORMAL HIGH (ref 70–99)
Glucose-Capillary: 144 mg/dL — ABNORMAL HIGH (ref 70–99)

## 2021-01-31 LAB — RESP PANEL BY RT-PCR (FLU A&B, COVID) ARPGX2
Influenza A by PCR: NEGATIVE
Influenza B by PCR: NEGATIVE
SARS Coronavirus 2 by RT PCR: NEGATIVE

## 2021-01-31 LAB — MAGNESIUM: Magnesium: 1.8 mg/dL (ref 1.7–2.4)

## 2021-01-31 LAB — LIPASE, BLOOD: Lipase: 25 U/L (ref 11–51)

## 2021-01-31 MED ORDER — ONDANSETRON HCL 4 MG/2ML IJ SOLN
4.0000 mg | Freq: Once | INTRAMUSCULAR | Status: AC
  Administered 2021-01-31: 4 mg via INTRAVENOUS
  Filled 2021-01-31: qty 2

## 2021-01-31 MED ORDER — ONDANSETRON HCL 4 MG/2ML IJ SOLN
4.0000 mg | Freq: Once | INTRAMUSCULAR | Status: AC | PRN
  Administered 2021-01-31: 4 mg via INTRAVENOUS
  Filled 2021-01-31: qty 2

## 2021-01-31 MED ORDER — LACTATED RINGERS IV BOLUS
1000.0000 mL | Freq: Once | INTRAVENOUS | Status: AC
  Administered 2021-01-31: 1000 mL via INTRAVENOUS

## 2021-01-31 MED ORDER — ONDANSETRON 4 MG PO TBDP: 4.0000 mg | ORAL_TABLET | Freq: Three times a day (TID) | ORAL | 0 refills | Status: DC | PRN

## 2021-01-31 NOTE — ED Notes (Signed)
Patient and family updated on POC. Dr. Fuller Plan at bedside. Warm blanket provided. Patient made aware of need for urine sample, hat placed in toilet. Patient verbalized understanding.

## 2021-01-31 NOTE — ED Triage Notes (Addendum)
Pt comes from [facility] with c/o dizziness, UTI and new diabetic.

## 2021-01-31 NOTE — Discharge Instructions (Addendum)
This could be related to the Macrobid versus viral illness.  Your Covid swab was negative.  Take the Zofran to help with nausea and vomiting.  Try to stay well-hydrated.  There was some signs that you are dehydrated since her kidney function was slightly elevated.   Stop using marijuana and take a food journal.  If this is not improving your nausea and vomiting follow-up with your primary care doctor for further testing.  Return to the ER if you develop any other concerns

## 2021-01-31 NOTE — ED Provider Notes (Addendum)
[facility] Emergency Department Provider Note  ____________________________________________   Event Date/Time   First MD Initiated Contact with Patient 01/31/21 1646     (approximate)  I have reviewed the triage vital signs and the nursing notes.   HISTORY  Chief Complaint Dizziness    HPI Elizabeth Dorsey is a 50 y.o. female with diabetes, cardiomegaly, hypertension, hyperlipidemia who comes in with dizziness.  Patient had dizziness, diaphoresis and nausea in the office.  Patient was recently diagnosed with UTI.  Patient was started on Macrobid.  Patient started taking the Macrobid and shortly afterwards she started feeling very ill.  She had nausea, vomiting, diarrhea.  No abdominal pain currently but before the diarrhea she will have a little bit of cramping but then will resolve.  Her vomiting is nonbloody nonbilious.  She denies any chest pain or shortness of breath.  She states that she is had some vomiting episodes with eating for some time now.  She is not on any medication for it.  She states that she is not talk to her doctor about it.  She states that this however was way worse than what she normally has.  Patient does use marijuana at least 2 times a week.          Past Medical History:  Diagnosis Date  . Cardiomegaly   . Depression   . Diabetes mellitus without complication (HCC)    managed by Endocrinology  . Diabetic neuropathy (HCC)   . Dyslipidemia   . Hyperlipidemia   . Hypertension   . Overweight   . Proteinuria   . Thyroid nodule    managed by Endocrinology  . Tobacco abuse     Patient Active Problem List   Diagnosis Date Noted  . Medication monitoring encounter 07/26/2015  . Diabetes mellitus without complication (HCC)   . Proteinuria   . Thyroid nodule   . Hypertension   . Cardiomegaly   . Dyslipidemia   . Depression   . Tobacco abuse   . Overweight     Past Surgical History:  Procedure Laterality Date   . CARDIAC CATHETERIZATION  2006  . TUBAL LIGATION Bilateral     Prior to Admission medications   Medication Sig Start Date End Date Taking? Authorizing Provider  ALPRAZolam Prudy Feeler) 0.5 MG tablet Take 0.5 mg by mouth 3 (three) times daily as needed. 07/19/15   [provider]  aspirin EC 81 MG tablet Take 81 mg by mouth daily.    [provider]  atorvastatin (LIPITOR) 40 MG tablet TAKE 1 TABLET BY MOUTH EVERYDAY AT BEDTIME 11/28/17   Gabriel Cirri, NP  buPROPion (WELLBUTRIN XL) 150 MG 24 hr tablet Take 150 mg by mouth. 06/13/15   [provider]  buPROPion (WELLBUTRIN XL) 300 MG 24 hr tablet Take 300 mg by mouth daily. 07/13/15   [provider]  ciprofloxacin (CIPRO) 250 MG tablet Take 1 tablet (250 mg total) by mouth 2 (two) times daily. 09/23/16   Gabriel Cirri, NP  escitalopram (LEXAPRO) 10 MG tablet Take 10 mg by mouth daily. 06/13/15   [provider]  escitalopram (LEXAPRO) 20 MG tablet Take 20 mg by mouth daily. 06/13/15   [provider]  glucose blood (ONE TOUCH ULTRA TEST) test strip  08/02/14   [provider]  insulin aspart (NOVOLOG) 100 UNIT/ML FlexPen Take 24-30 units three times daily before meals 12/26/15   [provider]  Insulin Degludec 200 UNIT/ML SOPN Inject 100 Units  into the skin daily. 06/05/16   [provider]  linagliptin (TRADJENTA) 5 MG TABS tablet Take 5 mg by mouth daily.    [provider]  lisinopril (PRINIVIL,ZESTRIL) 5 MG tablet Take 5 mg by mouth daily. 06/13/15   [provider]  metFORMIN (GLUCOPHAGE) 1000 MG tablet Take 1,000 mg by mouth 2 (two) times daily. 06/13/15   [provider]  nitrofurantoin (MACRODANTIN) 100 MG capsule Take 1 capsule (100 mg total) by mouth 2 (two) times daily. 09/16/16   Gabriel Cirri, NP    Allergies Amoxil [amoxicillin]  Family History  Problem Relation Age of Onset  . Diabetes Mother   . Hyperlipidemia Mother   .  Hypertension Mother   . Heart disease Father   . Hyperlipidemia Father   . Hypertension Father   . Drug abuse Brother   . Diabetes Maternal Grandmother   . Cancer Paternal Grandmother        breast  . Breast cancer Paternal Grandmother     Social History Social History   Tobacco Use  . Smoking status: Current Every Day Smoker    Packs/day: 1.00    Years: 30.00    Pack years: 30.00    Types: Cigarettes  . Smokeless tobacco: Never Used  Substance Use Topics  . Alcohol use: No  . Drug use: No      Review of Systems Constitutional: No fever/chills Eyes: No visual changes. ENT: No sore throat. Cardiovascular: Denies chest pain. Respiratory: Denies shortness of breath. Gastrointestinal: No abdominal pain.  Positive nausea, vomiting, diarrhea  genitourinary: Negative for dysuria. Musculoskeletal: Negative for back pain. Skin: Negative for rash. Neurological: Negative for headaches, focal weakness or numbness. All other ROS negative ____________________________________________   PHYSICAL EXAM:  VITAL SIGNS: ED Triage Vitals  Enc Vitals Group     BP 01/31/21 1602 (!) 106/57     Pulse Rate 01/31/21 1602 93     Resp 01/31/21 1602 20     Temp 01/31/21 1602 97.8 F (36.6 C)     Temp Source 01/31/21 1602 Oral     SpO2 --      Weight 01/31/21 1602 185 lb (83.9 kg)     Height 01/31/21 1602 5\' 6"  (1.676 m)     Head Circumference --      Peak Flow --      Pain Score 01/31/21 1602 0     Pain Loc --      Pain Edu? --      Excl. in GC? --     Constitutional: Alert and oriented. Well appearing and in no acute distress. Eyes: Conjunctivae are normal. EOMI. Head: Atraumatic. Nose: No congestion/rhinnorhea. Mouth/Throat: Mucous membranes are moist.   Neck: No stridor. Trachea Midline. FROM Cardiovascular: Normal rate, regular rhythm. Grossly normal heart sounds.  Good peripheral circulation. Respiratory: Normal respiratory effort.  No retractions. Lungs  CTAB. Gastrointestinal: Soft and nontender. No distention. No abdominal bruits.  Musculoskeletal: No lower extremity tenderness nor edema.  No joint effusions. Neurologic:  Normal speech and language. No gross focal neurologic deficits are appreciated.  Skin:  Skin is warm, dry and intact. No rash noted. Psychiatric: Mood and affect are normal. Speech and behavior are normal. GU: Deferred   ____________________________________________   LABS (all labs ordered are listed, but only abnormal results are displayed)  Labs Reviewed  BASIC METABOLIC PANEL - Abnormal; Notable for the following components:      Result Value   Potassium 3.4 (*)    Chloride  96 (*)    BUN 32 (*)    Creatinine, Ser 1.56 (*)    GFR, Estimated 40 (*)    Anion gap 16 (*)    All other components within normal limits  CBC - Abnormal; Notable for the following components:   WBC 13.4 (*)    Platelets 516 (*)    All other components within normal limits  CBG MONITORING, ED - Abnormal; Notable for the following components:   Glucose-Capillary 39 (*)    All other components within normal limits  CBG MONITORING, ED - Abnormal; Notable for the following components:   Glucose-Capillary 103 (*)    All other components within normal limits  RESP PANEL BY RT-PCR (FLU A&B, COVID) ARPGX2  GASTROINTESTINAL PANEL BY PCR, STOOL (REPLACES STOOL CULTURE)  C DIFFICILE (CDIFF) QUICK SCRN (NO PCR REFLEX)  MAGNESIUM  HEPATIC FUNCTION PANEL  LIPASE, BLOOD  URINALYSIS, COMPLETE (UACMP) WITH MICROSCOPIC  CBG MONITORING, ED  CBG MONITORING, ED   ____________________________________________   ED ECG REPORT I, Concha SeMary E Panda Crossin, the attending physician, personally viewed and interpreted this ECG.  Normal sinus rate of 99, no ST elevation, no T wave inversions, normal levels ____________________________________________   PROCEDURES  Procedure(s) performed (including Critical  Care):  Procedures   ____________________________________________   INITIAL IMPRESSION / ASSESSMENT AND PLAN / ED COURSE  Andy GaussMary Katherine Sherr was evaluated in Emergency Department on 01/31/2021 for the symptoms described in the history of present illness. She was evaluated in the context of the global COVID-19 pandemic, which necessitated consideration that the patient might be at risk for infection with the SARS-CoV-2 virus that causes COVID-19. Institutional protocols and algorithms that pertain to the evaluation of patients at risk for COVID-19 are in a state of rapid change based on information released by regulatory bodies including the CDC and federal and state organizations. These policies and algorithms were followed during the patient's care in the ED.    Patient is a well-appearing 50 year old who comes in with nausea, vomiting, diarrhea after being on a course of Macrobid for a UTI.  I suspect that this could be medication reaction therefore Macrobid was added as a allergy to patient's list.  Labs will be ordered to evaluate for Electra abnormalities, AKI and will keep a close eye on her sugar as well.  EKG was ordered but patient denies any chest pain and I do not think that this is a ACS mimic or given the diarrhea component of it.    Stated in the triage note that she had newly diagnosed diabetes but it is not true.  Patient's been diabetic for years is had no changes to her diabetic medications.  Her abdomen is soft and nontender I have low suspicion for acute abdominal process such as SBO, diverticulitis, appendicitis.  Given some diarrhea approximately 2 episodes a day we will get stool sample to test for C. difficile.  Also get Covid test although patient is vaccinated.  Patient does report also having some vomiting for years now that she is never talk to her doctor about.  I discussed with patient that she should have this further evaluated by her primary care doctor to see if she  warrants additional imaging.  She has no headache or neuro symptoms to suggest intracranial mass and no acute abdominal pain to suggest abdominal process at this time.  However she does use marijuana twice a week I told her that this could be contributing.  We discussed doing a food journal discontinue  the marijuana and starting some nausea medicine.  This is not helping her nausea and vomiting she needs to follow-up for further work-up by her PCP.   Kidney function elevated at 1.56 with a elevated anion gap although her sugar is normal at 70.  Her white count is slightly elevated at 13.4 and her platelets are elevated.  This could be a sign of infection although she was just recently treated for UTI.  However she is not septic appearing she has normal heart rate no fever and normal blood pressure.  Nothing we need to repeat urine given her symptoms have not resolved.  Patient is feeling much better after the fluids and Zofran.  She did have one low sugar but at that time she stated that she is felt fine.  Patient gave some p.o. food and was able to come up afterwards.  She is tolerating p.o.  Discussed with patient and patient would like to go home she is feeling much better now.  Will check 1 more more sugar and if adequate we will discharge patient home with some Zofran.  On repeat evaluation her abdomen remained soft nontender  Repeat sugar remains normal       ____________________________________________   FINAL CLINICAL IMPRESSION(S) / ED DIAGNOSES   Final diagnoses:  Nausea vomiting and diarrhea      MEDICATIONS GIVEN DURING THIS VISIT:  Medications  ondansetron (ZOFRAN) injection 4 mg (4 mg Intravenous Given 01/31/21 1615)  ondansetron (ZOFRAN) injection 4 mg (4 mg Intravenous Given 01/31/21 1724)  lactated ringers bolus 1,000 mL (0 mLs Intravenous Stopped 01/31/21 1815)     ED Discharge Orders         Ordered    ondansetron (ZOFRAN ODT) 4 MG disintegrating tablet  Every 8  hours PRN        01/31/21 2000           Note:  This document was prepared using Dragon voice recognition software and may include unintentional dictation errors.   Concha Se, MD 01/31/21 2000    Concha Se, MD 01/31/21 2025

## 2021-01-31 NOTE — ED Notes (Signed)
Patient given orange juice and crackers for CBG of 38. Dr. Fuller Plan notified.

## 2021-01-31 NOTE — ED Triage Notes (Addendum)
Pt comes into the ED via [facility] c/o dizziness, diaphoresis, and nausea in their office.  Pt recently diagnosed with UTI and dx of diabetes.  Pt started mew UTI medication which has started the nausea, but per the pt family, her CBG has been running high anywhere from 300-500.  Pt presents diaphoretic at this time and hunched over in the wheelchair.  Pt able to answer all questions correctly and has even and unlabored respirations. Last CBG with family was 98, CBG in triage 5 minutes later dropped to 78.

## 2021-02-13 IMAGING — MG DIGITAL SCREENING BILAT W/ TOMO W/ CAD
8 series · 8 of 24 positions shown · non-contrast
Comparison: Previous exam(s).

CLINICAL DATA: Screening.

EXAM:
DIGITAL SCREENING BILATERAL MAMMOGRAM WITH TOMO AND CAD

[L MLO synth-2D]
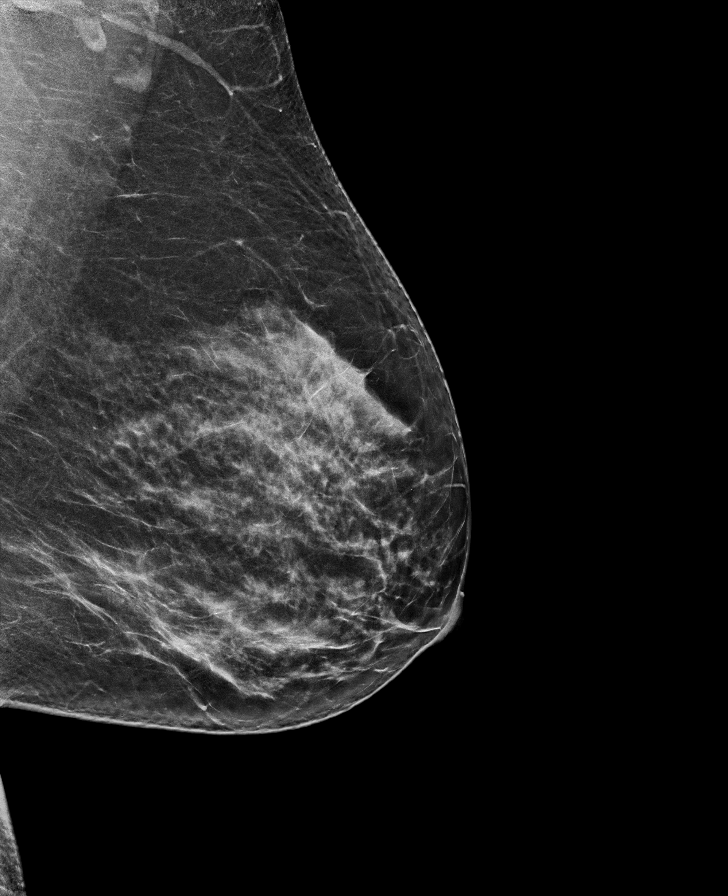

[L CC synth-2D]
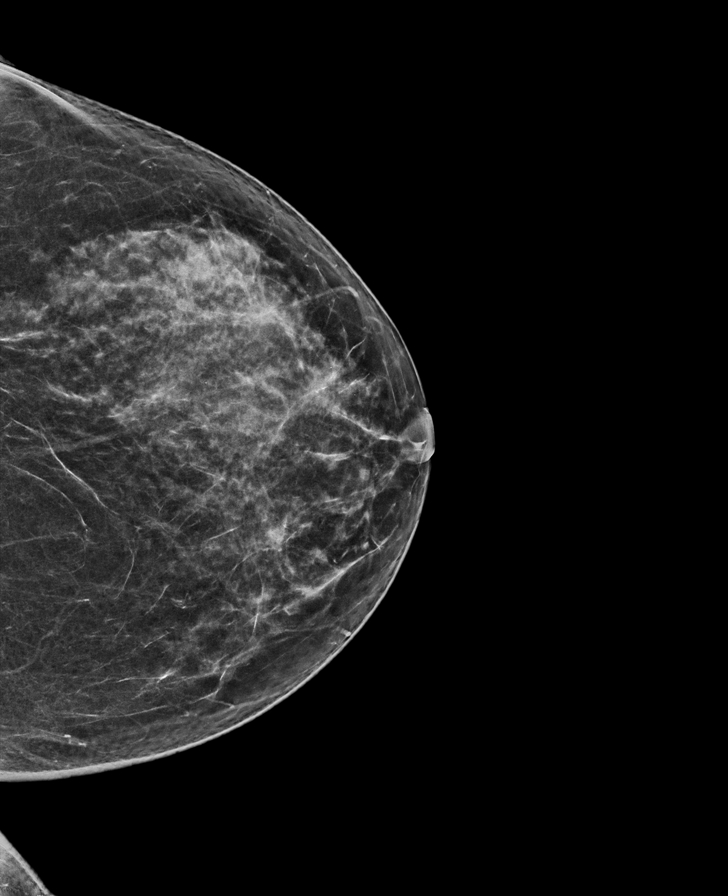

[R MLO synth-2D]
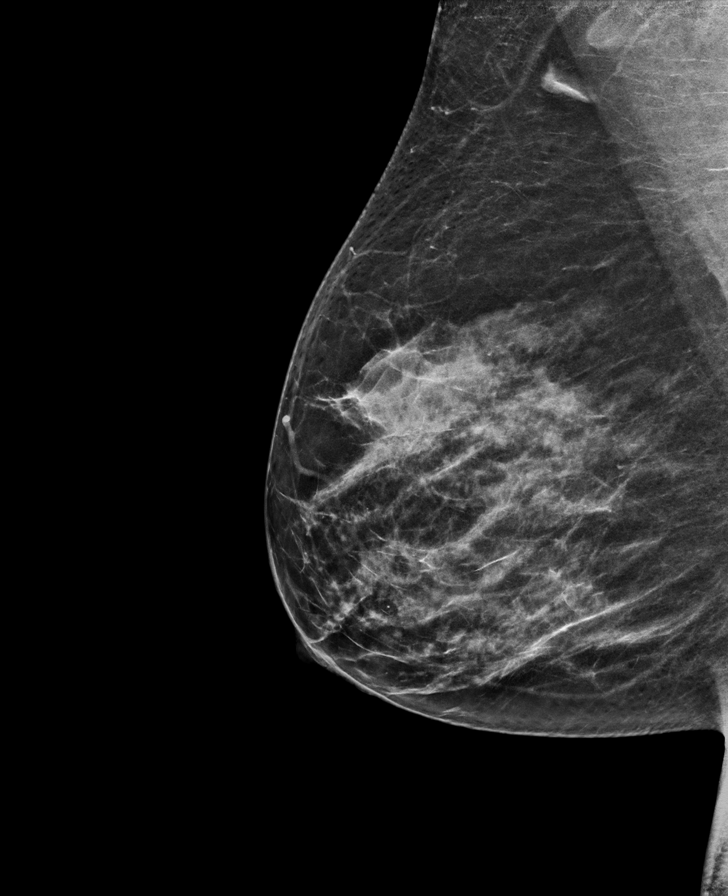

[R CC synth-2D]
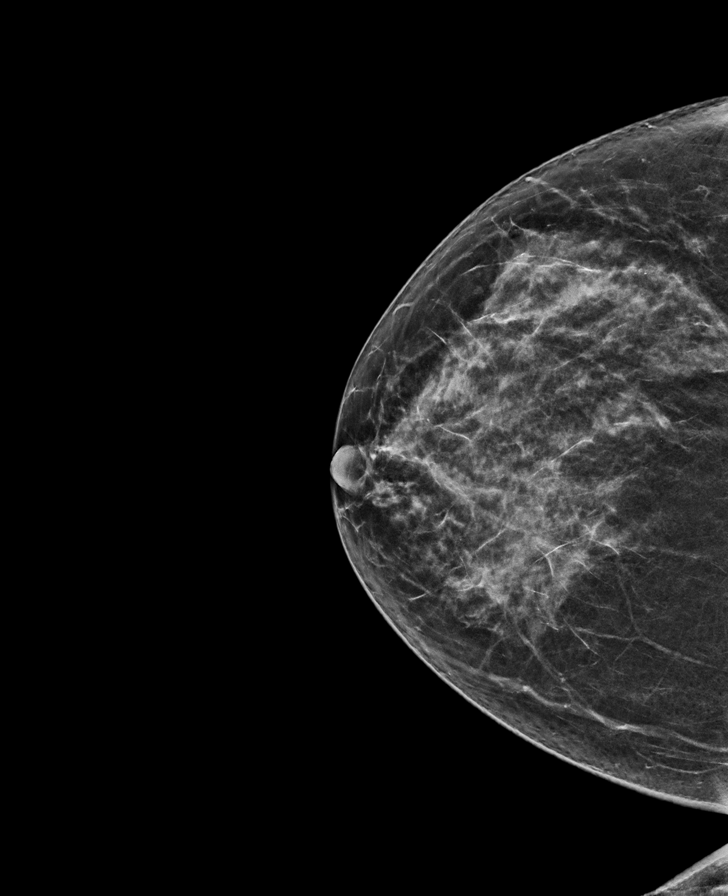

[R MLO tomo · tomo slice 35/68.0]
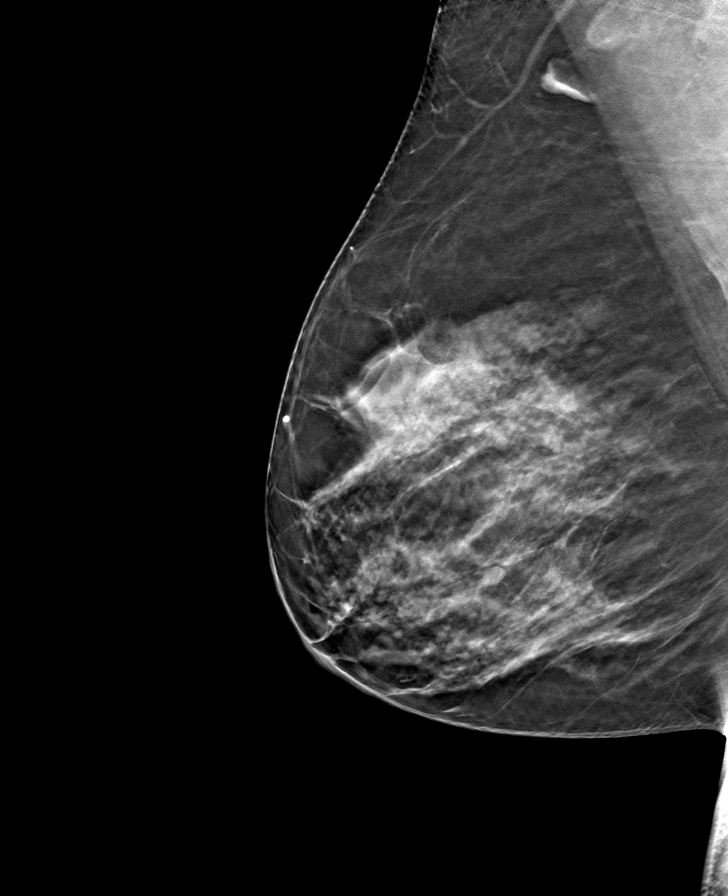

[L MLO tomo · tomo slice 37/72.0]
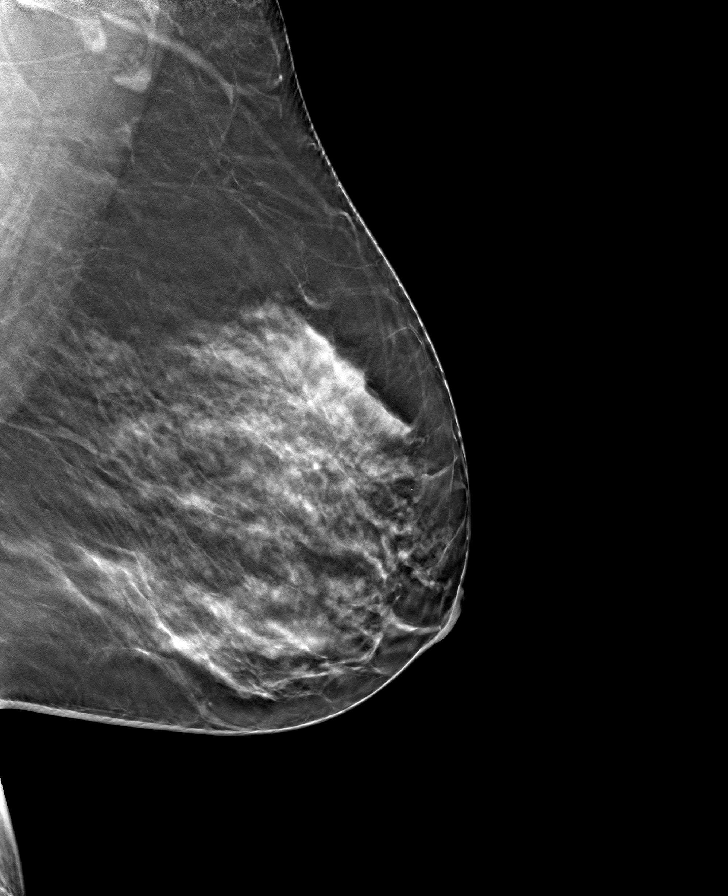

[R CC tomo · tomo slice 33/64.0]
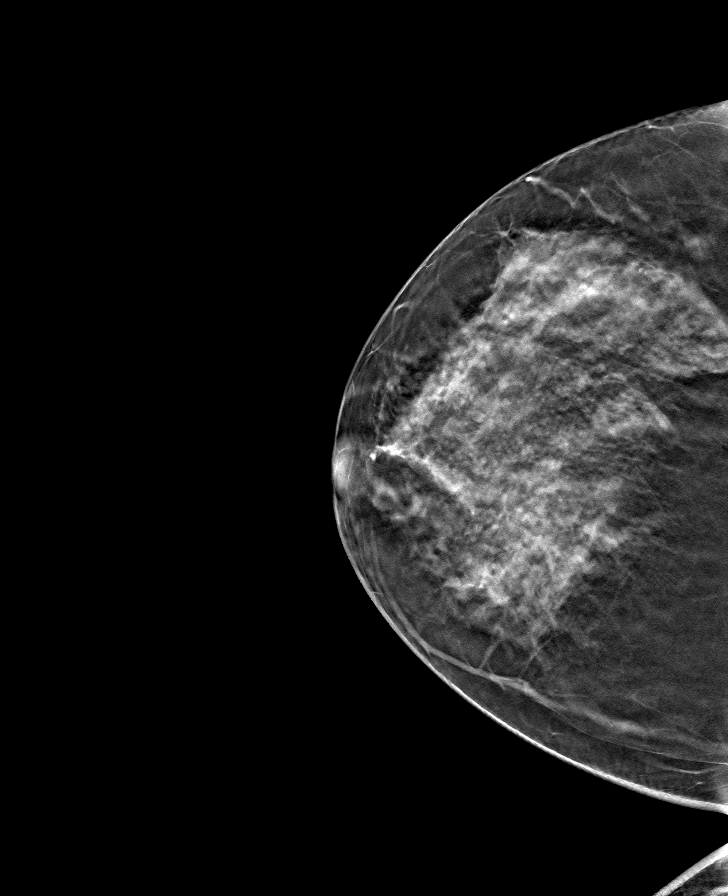

[L CC tomo · tomo slice 35/69.0]
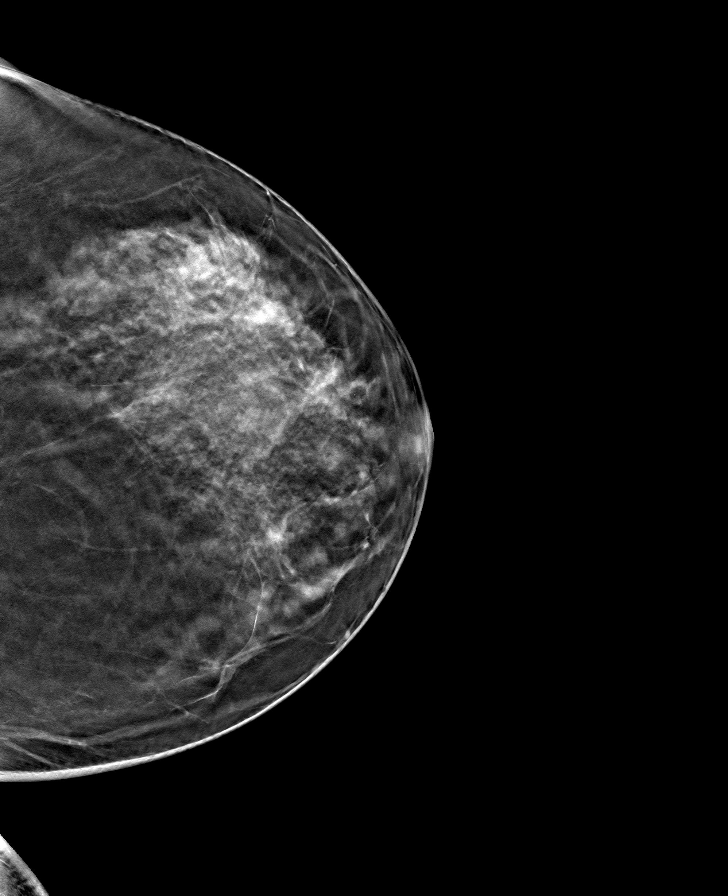

[8 of 24 positions shown; findings below may reference images not displayed]

ACR Breast Density Category c: The breast tissue is heterogeneously
dense, which may obscure small masses.
FINDINGS: There are no findings suspicious for malignancy. Images were
processed with CAD.
IMPRESSION: No mammographic evidence of malignancy. A result letter of this
screening mammogram will be mailed directly to the patient.

RECOMMENDATION:
Screening mammogram in one year. (Code:[id number])

BI-RADS CATEGORY  1: Negative.

## 2021-03-28 ENCOUNTER — Other Ambulatory Visit: Payer: Self-pay

## 2021-03-28 ENCOUNTER — Ambulatory Visit
Admission: RE | Admit: 2021-03-28 | Discharge: 2021-03-28 | Disposition: A | Discharge status: Home / Self Care | Payer: 59 | Source: Ambulatory Visit | Attending: Internal Medicine | Admitting: Internal Medicine

## 2021-03-28 DIAGNOSIS — Z1231 Encounter for screening mammogram for malignant neoplasm of breast: Secondary | ICD-10-CM | POA: Diagnosis present

## 2021-03-30 LAB — COLOGUARD: COLOGUARD: NEGATIVE

## 2022-01-09 DIAGNOSIS — E1165 Type 2 diabetes mellitus with hyperglycemia: Secondary | ICD-10-CM | POA: Diagnosis not present

## 2022-01-28 DIAGNOSIS — R300 Dysuria: Secondary | ICD-10-CM | POA: Diagnosis not present

## 2022-01-28 DIAGNOSIS — R350 Frequency of micturition: Secondary | ICD-10-CM | POA: Diagnosis not present

## 2022-02-03 DIAGNOSIS — L0291 Cutaneous abscess, unspecified: Secondary | ICD-10-CM | POA: Diagnosis not present

## 2022-02-05 ENCOUNTER — Ambulatory Visit: Payer: Self-pay | Admitting: General Surgery

## 2022-02-05 DIAGNOSIS — E11620 Type 2 diabetes mellitus with diabetic dermatitis: Secondary | ICD-10-CM | POA: Diagnosis not present

## 2022-02-05 DIAGNOSIS — L02211 Cutaneous abscess of abdominal wall: Secondary | ICD-10-CM | POA: Diagnosis not present

## 2022-02-05 DIAGNOSIS — R69 Illness, unspecified: Secondary | ICD-10-CM | POA: Diagnosis not present

## 2022-02-05 DIAGNOSIS — L03319 Cellulitis of trunk, unspecified: Secondary | ICD-10-CM | POA: Diagnosis not present

## 2022-02-05 DIAGNOSIS — E1165 Type 2 diabetes mellitus with hyperglycemia: Secondary | ICD-10-CM | POA: Diagnosis not present

## 2022-02-05 DIAGNOSIS — E042 Nontoxic multinodular goiter: Secondary | ICD-10-CM | POA: Diagnosis not present

## 2022-02-05 NOTE — H&P (View-Only) (Signed)
PATIENT PROFILE: °Elizabeth Dorsey is a 51 y.o. female who presents to the Clinic for consultation at the request of Dr. Kalisetti for evaluation of abscess of the abdominal wall. ° °PCP:  Kalisetti, Radhika, MD ° °HISTORY OF PRESENT ILLNESS: °Elizabeth Dorsey reports she has been feeling a knot in the abdominal wall since a week ago.  She endorses that he has been growing in size pretty quick.  She went to the urgent clinic 2 days ago.  She was started with antibiotic therapy.  Even with antibiotic therapy the patient endorses that he has been getting more painful and red.  Patient endorses the pain is localized to the abdominal wall on the left upper quadrant.  No pain radiation.  Aggravating factor is applying pressure.  There has been no alleviating factors.  She denies any fever or chills.  She denies any drainage.  She denies any previous infections.  She denies any trauma or any cause of her skin.  She does not inject insulin in this area. ° ° °PROBLEM LIST: °Problem List  Date Reviewed: 02/05/2022  ° °       Noted  ° Cardiomegaly 06/21/2020  ° Depression 06/21/2020  ° Dyslipidemia 06/21/2020  ° Overview  °  Last Assessment & Plan:  °Formatting of this note might be different from the original. °Check fasting lipids; new orders entered °  °  ° Hypertension 06/21/2020  ° Overview  °  Last Assessment & Plan:  °Formatting of this note might be different from the original. °Well-controlled; continue ACE-inhibitor °  °  ° Overweight 06/21/2020  ° Proteinuria 06/21/2020  ° Thyroid nodule 06/21/2020  ° Overview  °  Formatting of this note might be different from the original. °managed by Endocrinology °  °  ° Tobacco abuse 06/21/2020  ° Overview  °  Last Assessment & Plan:  °Formatting of this note might be different from the original. °She is not ready to quit smoking right now; I am here to help if/when that day comes °  °  ° Type 2 diabetes mellitus, uncontrolled 10/25/2014  ° ° °GENERAL REVIEW OF SYSTEMS:  ° °General ROS:  negative for - chills, fatigue, fever, weight gain or weight loss °Allergy and Immunology ROS: negative for - hives  °Hematological and Lymphatic ROS: negative for - bleeding problems or bruising, negative for palpable nodes °Endocrine ROS: negative for - heat or cold intolerance, hair changes °Respiratory ROS: negative for - cough, shortness of breath or wheezing °Cardiovascular ROS: no chest pain or palpitations °GI ROS: negative for nausea, vomiting, abdominal pain, diarrhea, constipation °Musculoskeletal ROS: negative for - joint swelling or muscle pain °Neurological ROS: negative for - confusion, syncope °Dermatological ROS: negative for pruritus and rash °Psychiatric: negative for anxiety, depression, difficulty sleeping and memory loss ° °MEDICATIONS: °Current Outpatient Medications  °Medication Sig Dispense Refill  ° aspirin 81 MG EC tablet Take by mouth.    ° atorvastatin (LIPITOR) 40 MG tablet TAKE 1 TABLET BY MOUTH AT BEDTIME FOR HIGH CHOLESTEROL 90 tablet 3  ° blood glucose diagnostic test strip Use 3 (three) times daily. 300 each 3  ° buPROPion (WELLBUTRIN XL) 150 MG XL tablet Take 450 mg by mouth once daily. Reported on 12/13/2015 °  12  ° buPROPion (WELLBUTRIN XL) 300 MG XL tablet TAKE 1 TABLET BY MOUTH IN THE MORNING WITH 150MG  2  ° escitalopram oxalate (LEXAPRO) 10 MG tablet Take 10 mg by mouth once daily    ° escitalopram oxalate (  LEXAPRO) 20 MG tablet Take 30 mg by mouth once daily Reported on 02/03/2016    ° flash glucose scanning (FREESTYLE LIBRE 14 DAY) reader Use 1 Device as directed 1 each 1  ° FREESTYLE LIBRE 14 DAY SENSOR kit Use 1 kit every 14 (fourteen) days for glucose monitoring 1 kit 3  ° ibuprofen (MOTRIN) 800 MG tablet     ° insulin ASPART (NOVOLOG FLEXPEN U-100 INSULIN) pen injector (concentration 100 units/mL) TAKE UP TO 100 UNITS DAILY IN DIVIDED DOSES AS DIRECTED 90 mL 4  ° lisinopriL (ZESTRIL) 5 MG tablet TAKE 1 TABLET BY MOUTH EVERY DAY 90 tablet 1  ° metFORMIN (GLUCOPHAGE) 500  MG tablet TAKE 1 TABLET BY MOUTH TWICE A DAY 180 tablet 1  ° pen needle, diabetic (PEN NEEDLE) 31 gauge x 5/16" needle Use 4 times daily with insulin pens 150 each 11  ° sulfamethoxazole-trimethoprim (BACTRIM DS) 800-160 mg tablet     ° tirzepatide (MOUNJARO) 2.5 mg/0.5 mL PnIj Inject 2.5 mg subcutaneously every 7 (seven) days 2 mL 5  ° TRESIBA FLEXTOUCH U-200 pen injector (concentration 200 units/mL) INJECT 70 UNITS SUBCUTANEOUSLY NIGHTLY 18 mL 4  ° °No current facility-administered medications for this visit.  ° ° °ALLERGIES: °Amoxicillin, Macrobid [nitrofurantoin monohyd/m-cryst], and Penicillin ° °PAST MEDICAL HISTORY: °Past Medical History:  °Diagnosis Date  ° Anxiety   ° Cholecystitis   ° Depression   ° Diabetes (CMS-HCC)   ° Dysmenorrhea   ° Elevated cholesterol   ° Family history of premature coronary artery disease   ° History of bronchitis 09/2003  ° History of chest pain   ° atypical  ° History of recurrent UTIs   ° Hyperlipidemia   ° Menorrhagia   ° Migraine headache   ° Multiple thyroid nodules   ° Obesity   ° probable metabolic syndrome  ° Panic attacks   ° Papanicolaou smear of cervix with positive high risk human papilloma virus (HPV) test   ° Polymenorrhea   ° Sleep apnea   ° mild  ° Tobacco abuse   ° Type 2 diabetes mellitus (CMS-HCC)   ° with microalbuminuria  ° ° °PAST SURGICAL HISTORY: °Past Surgical History:  °Procedure Laterality Date  ° TUBAL LIGATION Bilateral 2003  ° laparoscopic, with Falope rings; at [facility]  ° Endometrial biopsy   04/15/2013  ° Hysteroscopy and NovaSure  05/14/2013  ° Cardiac cath    ° Normal coronary arteries, normal function, normal aortogram.    ° CHOLECYSTECTOMY    ° LAPAROSCOPIC CHOLECYSTECTOMY  2003 at [facility] by Dr. Wilton Smith  °  ° °FAMILY HISTORY: °Family History  °Problem Relation Age of Onset  ° High blood pressure (Hypertension) Mother   ° Diabetes type II Mother   ° Obesity Mother   °     "overweight"  ° Myocardial Infarction (Heart attack) Father 40  °     MI x 7,  the first one at age 40, status post CABG  ° High blood pressure (Hypertension) Father   ° Heart disease Father   ° Transient ischemic attack Father   °     TIAs  ° Heart disease Other   ° Stroke Other   ° High blood pressure (Hypertension) Other   ° Diabetes Other   ° Asthma Neg Hx   °  ° °SOCIAL HISTORY: °Social History  ° °Socioeconomic History  ° Marital status: Married  °Tobacco Use  ° Smoking status: Every Day  °  Packs/day: 1.00  °  Types:   Cigarettes  °  Last attempt to quit: 02/13/2016  °  Years since quitting: 5.9  ° Smokeless tobacco: Never  °Vaping Use  ° Vaping Use: Never used  °Substance and Sexual Activity  ° Alcohol use: No  °  Alcohol/week: 0.0 standard drinks  ° Drug use: Never  ° Sexual activity: Not Currently  ° ° °PHYSICAL EXAM: °Vitals:  ° 02/05/22 1435  °BP: 138/84  °Pulse: 102  ° °Body mass index is 28.73 kg/m². °Weight: 80.7 kg (178 lb)  ° °GENERAL: Alert, active, oriented x3 ° °HEENT: Pupils equal reactive to light. Extraocular movements are intact. Sclera clear. Palpebral conjunctiva normal red color.Pharynx clear. ° °NECK: Supple with no palpable mass and no adenopathy. ° °LUNGS: Sound clear with no rales rhonchi or wheezes. ° °HEART: Regular rhythm S1 and S2 without murmur. ° °ABDOMEN: Soft and depressible, nontender with no palpable mass, no hepatomegaly.  Left upper quadrant 8 x 9 cm area of cellulitis, swelling and induration.  Bedside ultrasound shows complex fluid collection. ° °EXTREMITIES: Well-developed well-nourished symmetrical with no dependent edema. ° °NEUROLOGICAL: Awake alert oriented, facial expression symmetrical, moving all extremities. ° °REVIEW OF DATA: °I have reviewed the following data today: °Appointment on 01/28/2022  °Component Date Value  ° Color 01/28/2022 Orange (!)   ° Clarity 01/28/2022 SL Cloudy (!)   ° Specific Gravity 01/28/2022 1.020   ° pH, Urine 01/28/2022 5.0   ° Protein, Urinalysis 01/28/2022 30 (!)   ° Glucose, Urinalysis 01/28/2022 >=1000 (!)   °  Ketones, Urinalysis 01/28/2022 Trace (!)   ° Blood, Urinalysis 01/28/2022 Negative   ° Nitrite, Urinalysis 01/28/2022 Positive (!)   ° Leukocyte Esterase, Urin* 01/28/2022 Negative   ° White Blood Cells, Urina* 01/28/2022 4-10 (!)   ° Red Blood Cells, Urinaly* 01/28/2022 0-3   ° Bacteria, Urinalysis 01/28/2022 Moderate (!)   ° Squamous Epithelial Cell* 01/28/2022 Rare   ° Urine Culture, Routine -* 01/28/2022 Final report   ° Result 1 - [facility] 01/28/2022 Comment   °Appointment on 01/09/2022  °Component Date Value  ° Glucose 01/09/2022 243 (H)   ° Sodium 01/09/2022 140   ° Potassium 01/09/2022 4.4   ° Chloride 01/09/2022 103   ° Carbon Dioxide (CO2) 01/09/2022 28.1   ° Calcium 01/09/2022 9.3   ° Urea Nitrogen (BUN) 01/09/2022 12   ° Creatinine 01/09/2022 0.9   ° Glomerular Filtration Ra* 01/09/2022 66   ° BUN/Crea Ratio 01/09/2022 13.3   ° Anion Gap w/K 01/09/2022 13.3   ° Hemoglobin A1C 01/09/2022 10.7 (H)   ° Average Blood Glucose (C* 01/09/2022 260   °  ° °ASSESSMENT: °Elizabeth Dorsey is a 51 y.o. female presenting for consultation for abdominal wall complex abscess. ° °Patient with a complex outside of the left abdominal wall.  Due to the complexity and extension of the abscess I would recommend incision and drainage in the operation room.  I discussed patient of the goal of the incision and drainage that includes control of the infection.  She will continue taking oral antibiotic therapy.  I discussed with patient postoperative wound care with packing of the abscess cavity.  Patient with uncontrolled diabetes which makes her high risk of nonhealing.  Discussed with patient risk of surgery that includes bleeding, pain, recurrence of abscess, injury to deep tissues, among others.  The patient reports he understood and agreed to proceed. ° °Abdominal wall abscess [L02.211] ° °PLAN: °Incision and drainage of abdominal wall abscess (10061) °Continue antibiotic therapy as prescribed °Contact us   if you have any concern.   ° °Patient and life partner verbalized understanding, all questions were answered, and were agreeable with the plan outlined above.  ° ° ° °Shanon Seawright Cintron-Diaz, MD ° °Electronically signed by Jacques Fife Cintron-Diaz, MD ° °

## 2022-02-05 NOTE — H&P (Signed)
PATIENT PROFILE: Elizabeth Dorsey is a 51 y.o. female who presents to the Clinic for consultation at the request of Dr. Tressia Miners for evaluation of abscess of the abdominal wall.  PCP:  Gladstone Lighter, MD  HISTORY OF PRESENT ILLNESS: Ms. Stanwood reports she has been feeling a knot in the abdominal wall since a week ago.  She endorses that he has been growing in size pretty quick.  She went to the urgent clinic 2 days ago.  She was started with antibiotic therapy.  Even with antibiotic therapy the patient endorses that he has been getting more painful and red.  Patient endorses the pain is localized to the abdominal wall on the left upper quadrant.  No pain radiation.  Aggravating factor is applying pressure.  There has been no alleviating factors.  She denies any fever or chills.  She denies any drainage.  She denies any previous infections.  She denies any trauma or any cause of her skin.  She does not inject insulin in this area.   PROBLEM LIST: Problem List  Date Reviewed: 02/05/2022          Noted   Cardiomegaly 06/21/2020   Depression 06/21/2020   Dyslipidemia 06/21/2020   Overview    Last Assessment & Plan:  Formatting of this note might be different from the original. Check fasting lipids; new orders entered      Hypertension 06/21/2020   Overview    Last Assessment & Plan:  Formatting of this note might be different from the original. Well-controlled; continue ACE-inhibitor      Overweight 06/21/2020   Proteinuria 06/21/2020   Thyroid nodule 06/21/2020   Overview    Formatting of this note might be different from the original. managed by Endocrinology      Tobacco abuse 06/21/2020   Overview    Last Assessment & Plan:  Formatting of this note might be different from the original. She is not ready to quit smoking right now; I am here to help if/when that day comes      Type 2 diabetes mellitus, uncontrolled 10/25/2014    GENERAL REVIEW OF SYSTEMS:   General ROS:  negative for - chills, fatigue, fever, weight gain or weight loss Allergy and Immunology ROS: negative for - hives  Hematological and Lymphatic ROS: negative for - bleeding problems or bruising, negative for palpable nodes Endocrine ROS: negative for - heat or cold intolerance, hair changes Respiratory ROS: negative for - cough, shortness of breath or wheezing Cardiovascular ROS: no chest pain or palpitations GI ROS: negative for nausea, vomiting, abdominal pain, diarrhea, constipation Musculoskeletal ROS: negative for - joint swelling or muscle pain Neurological ROS: negative for - confusion, syncope Dermatological ROS: negative for pruritus and rash Psychiatric: negative for anxiety, depression, difficulty sleeping and memory loss  MEDICATIONS: Current Outpatient Medications  Medication Sig Dispense Refill   aspirin 81 MG EC tablet Take by mouth.     atorvastatin (LIPITOR) 40 MG tablet TAKE 1 TABLET BY MOUTH AT BEDTIME FOR HIGH CHOLESTEROL 90 tablet 3   blood glucose diagnostic test strip Use 3 (three) times daily. 300 each 3   buPROPion (WELLBUTRIN XL) 150 MG XL tablet Take 450 mg by mouth once daily. Reported on 12/13/2015   12   buPROPion (WELLBUTRIN XL) 300 MG XL tablet TAKE 1 TABLET BY MOUTH IN THE MORNING WITH 150MG  2   escitalopram oxalate (LEXAPRO) 10 MG tablet Take 10 mg by mouth once daily     escitalopram oxalate (  LEXAPRO) 20 MG tablet Take 30 mg by mouth once daily Reported on 02/03/2016     flash glucose scanning (FREESTYLE LIBRE 14 DAY) reader Use 1 Device as directed 1 each 1   FREESTYLE LIBRE 14 DAY SENSOR kit Use 1 kit every 14 (fourteen) days for glucose monitoring 1 kit 3   ibuprofen (MOTRIN) 800 MG tablet      insulin ASPART (NOVOLOG FLEXPEN U-100 INSULIN) pen injector (concentration 100 units/mL) TAKE UP TO 100 UNITS DAILY IN DIVIDED DOSES AS DIRECTED 90 mL 4   lisinopriL (ZESTRIL) 5 MG tablet TAKE 1 TABLET BY MOUTH EVERY DAY 90 tablet 1   metFORMIN (GLUCOPHAGE) 500  MG tablet TAKE 1 TABLET BY MOUTH TWICE A DAY 180 tablet 1   pen needle, diabetic (PEN NEEDLE) 31 gauge x 5/16" needle Use 4 times daily with insulin pens 150 each 11   sulfamethoxazole-trimethoprim (BACTRIM DS) 800-160 mg tablet      tirzepatide (MOUNJARO) 2.5 mg/0.5 mL PnIj Inject 2.5 mg subcutaneously every 7 (seven) days 2 mL 5   TRESIBA FLEXTOUCH U-200 pen injector (concentration 200 units/mL) INJECT 70 UNITS SUBCUTANEOUSLY NIGHTLY 18 mL 4   No current facility-administered medications for this visit.    ALLERGIES: Amoxicillin, Macrobid [nitrofurantoin monohyd/m-cryst], and Penicillin  PAST MEDICAL HISTORY: Past Medical History:  Diagnosis Date   Anxiety    Cholecystitis    Depression    Diabetes (CMS-HCC)    Dysmenorrhea    Elevated cholesterol    Family history of premature coronary artery disease    History of bronchitis 09/2003   History of chest pain    atypical   History of recurrent UTIs    Hyperlipidemia    Menorrhagia    Migraine headache    Multiple thyroid nodules    Obesity    probable metabolic syndrome   Panic attacks    Papanicolaou smear of cervix with positive high risk human papilloma virus (HPV) test    Polymenorrhea    Sleep apnea    mild   Tobacco abuse    Type 2 diabetes mellitus (CMS-HCC)    with microalbuminuria    PAST SURGICAL HISTORY: Past Surgical History:  Procedure Laterality Date   TUBAL LIGATION Bilateral 2003   laparoscopic, with Falope rings; at [facility]   Endometrial biopsy   04/15/2013   Hysteroscopy and NovaSure  05/14/2013   Cardiac cath     Normal coronary arteries, normal function, normal aortogram.     CHOLECYSTECTOMY     LAPAROSCOPIC CHOLECYSTECTOMY  2003 at [facility] by Dr. Rochel Brome     FAMILY HISTORY: Family History  Problem Relation Age of Onset   High blood pressure (Hypertension) Mother    Diabetes type II Mother    Obesity Mother        "overweight"   Myocardial Infarction (Heart attack) Father 43       MI x 7,  the first one at age 55, status post CABG   High blood pressure (Hypertension) Father    Heart disease Father    Transient ischemic attack Father        TIAs   Heart disease Other    Stroke Other    High blood pressure (Hypertension) Other    Diabetes Other    Asthma Neg Hx      SOCIAL HISTORY: Social History   Socioeconomic History   Marital status: Married  Tobacco Use   Smoking status: Every Day    Packs/day: 1.00    Types:  Cigarettes    Last attempt to quit: 02/13/2016    Years since quitting: 5.9   Smokeless tobacco: Never  Vaping Use   Vaping Use: Never used  Substance and Sexual Activity   Alcohol use: No    Alcohol/week: 0.0 standard drinks   Drug use: Never   Sexual activity: Not Currently    PHYSICAL EXAM: Vitals:   02/05/22 1435  BP: 138/84  Pulse: 102   Body mass index is 28.73 kg/m. Weight: 80.7 kg (178 lb)   GENERAL: Alert, active, oriented x3  HEENT: Pupils equal reactive to light. Extraocular movements are intact. Sclera clear. Palpebral conjunctiva normal red color.Pharynx clear.  NECK: Supple with no palpable mass and no adenopathy.  LUNGS: Sound clear with no rales rhonchi or wheezes.  HEART: Regular rhythm S1 and S2 without murmur.  ABDOMEN: Soft and depressible, nontender with no palpable mass, no hepatomegaly.  Left upper quadrant 8 x 9 cm area of cellulitis, swelling and induration.  Bedside ultrasound shows complex fluid collection.  EXTREMITIES: Well-developed well-nourished symmetrical with no dependent edema.  NEUROLOGICAL: Awake alert oriented, facial expression symmetrical, moving all extremities.  REVIEW OF DATA: I have reviewed the following data today: Appointment on 01/28/2022  Component Date Value   Color 01/28/2022 Orange (!)    Clarity 01/28/2022 SL Cloudy (!)    Specific Gravity 01/28/2022 1.020    pH, Urine 01/28/2022 5.0    Protein, Urinalysis 01/28/2022 30 (!)    Glucose, Urinalysis 01/28/2022 >=1000 (!)     Ketones, Urinalysis 01/28/2022 Trace (!)    Blood, Urinalysis 01/28/2022 Negative    Nitrite, Urinalysis 01/28/2022 Positive (!)    Leukocyte Esterase, Urin* 01/28/2022 Negative    White Blood Cells, Urina* 01/28/2022 4-10 (!)    Red Blood Cells, Urinaly* 01/28/2022 0-3    Bacteria, Urinalysis 01/28/2022 Moderate (!)    Squamous Epithelial Cell* 01/28/2022 Rare    Urine Culture, Routine -* 01/28/2022 Final report    Result 1 - [facility] 01/28/2022 Comment   Appointment on 01/09/2022  Component Date Value   Glucose 01/09/2022 243 (H)    Sodium 01/09/2022 140    Potassium 01/09/2022 4.4    Chloride 01/09/2022 103    Carbon Dioxide (CO2) 01/09/2022 28.1    Calcium 01/09/2022 9.3    Urea Nitrogen (BUN) 01/09/2022 12    Creatinine 01/09/2022 0.9    Glomerular Filtration Ra* 01/09/2022 66    BUN/Crea Ratio 01/09/2022 13.3    Anion Gap w/K 01/09/2022 13.3    Hemoglobin A1C 01/09/2022 10.7 (H)    Average Blood Glucose (C* 01/09/2022 260      ASSESSMENT: Ms. Califf is a 51 y.o. female presenting for consultation for abdominal wall complex abscess.  Patient with a complex outside of the left abdominal wall.  Due to the complexity and extension of the abscess I would recommend incision and drainage in the operation room.  I discussed patient of the goal of the incision and drainage that includes control of the infection.  She will continue taking oral antibiotic therapy.  I discussed with patient postoperative wound care with packing of the abscess cavity.  Patient with uncontrolled diabetes which makes her high risk of nonhealing.  Discussed with patient risk of surgery that includes bleeding, pain, recurrence of abscess, injury to deep tissues, among others.  The patient reports he understood and agreed to proceed.  Abdominal wall abscess [L02.211]  PLAN: Incision and drainage of abdominal wall abscess (10061) Continue antibiotic therapy as prescribed Contact us  if you have any concern.    Patient and life partner verbalized understanding, all questions were answered, and were agreeable with the plan outlined above.     Herbert Pun, MD  Electronically signed by Herbert Pun, MD

## 2022-02-06 ENCOUNTER — Other Ambulatory Visit: Payer: Self-pay

## 2022-02-06 ENCOUNTER — Ambulatory Visit: Payer: 59 | Admitting: Anesthesiology

## 2022-02-06 ENCOUNTER — Encounter
Admission: RE | Disposition: A | Discharge status: Home / Self Care | Payer: Self-pay | Source: Home / Self Care | Attending: General Surgery

## 2022-02-06 ENCOUNTER — Encounter: Payer: Self-pay | Admitting: General Surgery

## 2022-02-06 ENCOUNTER — Ambulatory Visit
Admission: RE | Admit: 2022-02-06 | Discharge: 2022-02-06 | Disposition: A | Discharge status: Home / Self Care | Payer: 59 | Attending: General Surgery | Admitting: General Surgery

## 2022-02-06 DIAGNOSIS — E1140 Type 2 diabetes mellitus with diabetic neuropathy, unspecified: Secondary | ICD-10-CM | POA: Diagnosis not present

## 2022-02-06 DIAGNOSIS — Z7985 Long-term (current) use of injectable non-insulin antidiabetic drugs: Secondary | ICD-10-CM | POA: Diagnosis not present

## 2022-02-06 DIAGNOSIS — L02211 Cutaneous abscess of abdominal wall: Secondary | ICD-10-CM | POA: Insufficient documentation

## 2022-02-06 DIAGNOSIS — Z793 Long term (current) use of hormonal contraceptives: Secondary | ICD-10-CM | POA: Insufficient documentation

## 2022-02-06 DIAGNOSIS — I119 Hypertensive heart disease without heart failure: Secondary | ICD-10-CM | POA: Insufficient documentation

## 2022-02-06 DIAGNOSIS — E785 Hyperlipidemia, unspecified: Secondary | ICD-10-CM | POA: Diagnosis not present

## 2022-02-06 DIAGNOSIS — E1165 Type 2 diabetes mellitus with hyperglycemia: Secondary | ICD-10-CM | POA: Diagnosis not present

## 2022-02-06 DIAGNOSIS — F17290 Nicotine dependence, other tobacco product, uncomplicated: Secondary | ICD-10-CM | POA: Insufficient documentation

## 2022-02-06 DIAGNOSIS — R69 Illness, unspecified: Secondary | ICD-10-CM | POA: Diagnosis not present

## 2022-02-06 HISTORY — PX: INCISION AND DRAINAGE ABSCESS: SHX5864

## 2022-02-06 LAB — GLUCOSE, CAPILLARY
Glucose-Capillary: 340 mg/dL — ABNORMAL HIGH (ref 70–99)
Glucose-Capillary: 307 mg/dL — ABNORMAL HIGH (ref 70–99)

## 2022-02-06 SURGERY — INCISION AND DRAINAGE, ABSCESS
Anesthesia: General | Site: Abdomen

## 2022-02-06 MED ORDER — OXYCODONE HCL 5 MG/5ML PO SOLN: 5.0000 mg | Freq: Once | ORAL | Status: DC | PRN

## 2022-02-06 MED ORDER — SODIUM CHLORIDE 0.9 % IV SOLN: INTRAVENOUS | Status: DC

## 2022-02-06 MED ORDER — 0.9 % SODIUM CHLORIDE (POUR BTL) OPTIME
TOPICAL | Status: DC | PRN
  Administered 2022-02-06: 1000 mL

## 2022-02-06 MED ORDER — PROPOFOL 10 MG/ML IV BOLUS
INTRAVENOUS | Status: AC
  Filled 2022-02-06: qty 20

## 2022-02-06 MED ORDER — HYDROGEN PEROXIDE 3 % EX SOLN
CUTANEOUS | Status: DC | PRN
  Administered 2022-02-06: 1

## 2022-02-06 MED ORDER — MIDAZOLAM HCL 2 MG/2ML IJ SOLN
INTRAMUSCULAR | Status: DC | PRN
Start: 2022-02-06 — End: 2022-02-06
  Administered 2022-02-06: 2 mg via INTRAVENOUS

## 2022-02-06 MED ORDER — ONDANSETRON HCL 4 MG/2ML IJ SOLN: 4.0000 mg | Freq: Once | INTRAMUSCULAR | Status: DC | PRN

## 2022-02-06 MED ORDER — ORAL CARE MOUTH RINSE: 15.0000 mL | Freq: Once | OROMUCOSAL | Status: AC

## 2022-02-06 MED ORDER — INSULIN ASPART 100 UNIT/ML IJ SOLN
INTRAMUSCULAR | Status: AC
  Filled 2022-02-06: qty 10

## 2022-02-06 MED ORDER — PROPOFOL 500 MG/50ML IV EMUL
INTRAVENOUS | Status: DC | PRN
  Administered 2022-02-06: 100 ug/kg/min via INTRAVENOUS

## 2022-02-06 MED ORDER — CHLORHEXIDINE GLUCONATE 0.12 % MT SOLN
OROMUCOSAL | Status: AC
  Administered 2022-02-06: 15 mL via OROMUCOSAL
  Filled 2022-02-06: qty 15

## 2022-02-06 MED ORDER — TRAMADOL HCL 50 MG PO TABS: 50.0000 mg | ORAL_TABLET | Freq: Four times a day (QID) | ORAL | 0 refills | Status: AC | PRN

## 2022-02-06 MED ORDER — PROPOFOL 10 MG/ML IV BOLUS
INTRAVENOUS | Status: DC | PRN
  Administered 2022-02-06: 50 mg via INTRAVENOUS
  Administered 2022-02-06: 60 mg via INTRAVENOUS
  Administered 2022-02-06: 30 mg via INTRAVENOUS

## 2022-02-06 MED ORDER — INSULIN ASPART 100 UNIT/ML IJ SOLN
10.0000 [IU] | Freq: Once | INTRAMUSCULAR | Status: AC
  Administered 2022-02-06: 10 [IU] via SUBCUTANEOUS

## 2022-02-06 MED ORDER — DEXMEDETOMIDINE (PRECEDEX) IN NS 20 MCG/5ML (4 MCG/ML) IV SYRINGE
PREFILLED_SYRINGE | INTRAVENOUS | Status: DC | PRN
  Administered 2022-02-06: 8 ug via INTRAVENOUS
  Administered 2022-02-06: 12 ug via INTRAVENOUS

## 2022-02-06 MED ORDER — INSULIN ASPART 100 UNIT/ML IJ SOLN
INTRAMUSCULAR | Status: AC
  Filled 2022-02-06: qty 1

## 2022-02-06 MED ORDER — PHENYLEPHRINE 40 MCG/ML (10ML) SYRINGE FOR IV PUSH (FOR BLOOD PRESSURE SUPPORT)
PREFILLED_SYRINGE | INTRAVENOUS | Status: DC | PRN
Start: 2022-02-06 — End: 2022-02-06
  Administered 2022-02-06: 80 ug via INTRAVENOUS

## 2022-02-06 MED ORDER — MIDAZOLAM HCL 2 MG/2ML IJ SOLN
INTRAMUSCULAR | Status: AC
  Filled 2022-02-06: qty 2

## 2022-02-06 MED ORDER — FENTANYL CITRATE (PF) 100 MCG/2ML IJ SOLN: 25.0000 ug | INTRAMUSCULAR | Status: DC | PRN

## 2022-02-06 MED ORDER — FENTANYL CITRATE (PF) 100 MCG/2ML IJ SOLN
INTRAMUSCULAR | Status: DC | PRN
  Administered 2022-02-06 (×2): 25 ug via INTRAVENOUS

## 2022-02-06 MED ORDER — OXYCODONE HCL 5 MG PO TABS: 5.0000 mg | ORAL_TABLET | Freq: Once | ORAL | Status: DC | PRN

## 2022-02-06 MED ORDER — ACETAMINOPHEN 10 MG/ML IV SOLN: 1000.0000 mg | Freq: Once | INTRAVENOUS | Status: DC | PRN

## 2022-02-06 MED ORDER — PROPOFOL 500 MG/50ML IV EMUL
INTRAVENOUS | Status: AC
  Filled 2022-02-06: qty 50

## 2022-02-06 MED ORDER — CHLORHEXIDINE GLUCONATE 0.12 % MT SOLN: 15.0000 mL | Freq: Once | OROMUCOSAL | Status: AC

## 2022-02-06 MED ORDER — LACTATED RINGERS IV SOLN: INTRAVENOUS | Status: DC

## 2022-02-06 MED ORDER — LIDOCAINE HCL (PF) 2 % IJ SOLN
INTRAMUSCULAR | Status: AC
  Filled 2022-02-06: qty 5

## 2022-02-06 MED ORDER — PHENYLEPHRINE 40 MCG/ML (10ML) SYRINGE FOR IV PUSH (FOR BLOOD PRESSURE SUPPORT)
PREFILLED_SYRINGE | INTRAVENOUS | Status: AC
  Filled 2022-02-06: qty 10

## 2022-02-06 MED ORDER — FENTANYL CITRATE (PF) 100 MCG/2ML IJ SOLN
INTRAMUSCULAR | Status: AC
  Filled 2022-02-06: qty 2

## 2022-02-06 MED ORDER — BUPIVACAINE HCL (PF) 0.5 % IJ SOLN
INTRAMUSCULAR | Status: DC | PRN
  Administered 2022-02-06: 30 mL

## 2022-02-06 MED ORDER — LIDOCAINE HCL (CARDIAC) PF 100 MG/5ML IV SOSY
PREFILLED_SYRINGE | INTRAVENOUS | Status: DC | PRN
  Administered 2022-02-06: 80 mg via INTRATRACHEAL

## 2022-02-06 MED ORDER — BUPIVACAINE HCL (PF) 0.5 % IJ SOLN
INTRAMUSCULAR | Status: AC
  Filled 2022-02-06: qty 30

## 2022-02-06 SURGICAL SUPPLY — 26 items
APL PRP STRL LF DISP 70% ISPRP (MISCELLANEOUS) ×1
BLADE CLIPPER SURG (BLADE) ×2 IMPLANT
BLADE SURG 15 STRL LF DISP TIS (BLADE) ×2 IMPLANT
BLADE SURG 15 STRL SS (BLADE) ×2
BRUSH SCRUB EZ  4% CHG (MISCELLANEOUS)
BRUSH SCRUB EZ 4% CHG (MISCELLANEOUS) ×2 IMPLANT
CHLORAPREP W/TINT 26 (MISCELLANEOUS) ×1 IMPLANT
DRAPE CHEST BREAST 77X106 FENE (MISCELLANEOUS) IMPLANT
DRAPE LAPAROTOMY 77X122 PED (DRAPES) ×3 IMPLANT
ELECT REM PT RETURN 9FT ADLT (ELECTROSURGICAL) ×2
ELECTRODE REM PT RTRN 9FT ADLT (ELECTROSURGICAL) ×2 IMPLANT
GAUZE 4X4 16PLY ~~LOC~~+RFID DBL (SPONGE) ×3 IMPLANT
GAUZE PACKING FOLDED 1IN STRL (GAUZE/BANDAGES/DRESSINGS) ×1 IMPLANT
GAUZE SPONGE 4X4 12PLY STRL (GAUZE/BANDAGES/DRESSINGS) ×1 IMPLANT
GLOVE SURG ENC MOIS LTX SZ6.5 (GLOVE) ×3 IMPLANT
GLOVE SURG UNDER POLY LF SZ6.5 (GLOVE) ×3 IMPLANT
GOWN STRL REUS W/ TWL LRG LVL3 (GOWN DISPOSABLE) ×4 IMPLANT
GOWN STRL REUS W/TWL LRG LVL3 (GOWN DISPOSABLE) ×4
MANIFOLD NEPTUNE II (INSTRUMENTS) ×3 IMPLANT
NEEDLE HYPO 22GX1.5 SAFETY (NEEDLE) ×3 IMPLANT
NS IRRIG 1000ML POUR BTL (IV SOLUTION) ×3 IMPLANT
PACK BASIN MINOR ARMC (MISCELLANEOUS) ×3 IMPLANT
SOL PREP PVP 2OZ (MISCELLANEOUS)
SOLUTION PREP PVP 2OZ (MISCELLANEOUS) ×4 IMPLANT
SPONGE T-LAP 18X18 ~~LOC~~+RFID (SPONGE) ×3 IMPLANT
WATER STERILE IRR 500ML POUR (IV SOLUTION) ×2 IMPLANT

## 2022-02-06 NOTE — Anesthesia Postprocedure Evaluation (Signed)
Anesthesia Post Note ? ?Patient: Elizabeth Dorsey ? ?Procedure(s) Performed: INCISION AND DRAINAGE ABSCESS (Abdomen) ? ?Patient location during evaluation: PACU ?Anesthesia Type: General ?Level of consciousness: awake and alert, oriented and patient cooperative ?Pain management: pain level controlled ?Vital Signs Assessment: post-procedure vital signs reviewed and stable ?Respiratory status: spontaneous breathing, nonlabored ventilation and respiratory function stable ?Cardiovascular status: blood pressure returned to baseline and stable ?Postop Assessment: adequate PO intake ?Anesthetic complications: no ? ? ?No notable events documented. ? ? ?Last Vitals:  ?Vitals:  ? 02/06/22 0953 02/06/22 1005  ?BP: (!) 109/51 107/65  ?Pulse: 73   ?Resp: 13 16  ?Temp: (!) 36.3 ?C 37 ?C  ?SpO2: 97% 97%  ?  ?Last Pain:  ?Vitals:  ? 02/06/22 1005  ?TempSrc:   ?PainSc: 0-No pain  ? ? ?  ?  ?  ?  ?  ?  ? ?Reed Breech ? ? ? ? ?

## 2022-02-06 NOTE — Op Note (Signed)
Preoperative diagnosis: Abdominal wall abscess  ? ?Postoperative diagnosis: Abdominal wall abscess ? ?Procedure: Incision and drainage of abdominal wall abscess. ? ?Anesthesia: MAC, Local ? ?Surgeon: Dr. Windell Moment, MD ? ?Wound Classification: Contaminated ? ?Indications: Patient is a 51 y.o. female  presented with an abscess on the abdominal wall, increasing in size, not responding to antibiotic therapy ? ?Findings:  ?1. Abscess on the left upper quadrant abdominal wall ?2. Abundant purulent secretions drained and cultured ?3. Adequate hemostasis.  ? ?Description of procedure: The patient was placed in the supine position and MAC was induced. The abdominal wall was prepped and draped in the usual sterile fashion. A timeout was completed verifying correct patient, procedure, site, positioning, and implant(s) and/or special equipment prior to beginning this procedure.  ?A skin incision was made over the fluctuant area on the left upper abdominal wall. The wound was opened and purulent secretions was drained. With a hemostat blunt dissection of septas was done to be able to drain the abscess completely. Infected tissue removed with electrocautery. The cavity was irrigated with peroxide. The cavity flushed with saline until clear saline was aspirated. The wound was packed with an 4 x 4 saline gauze packing and wound left opened. Sterile dressing left in place. Local anesthesia infiltrated away of the infected area to block the area.  ?The patient tolerated the procedure well and was taken to the postanesthesia care unit in satisfactory condition.  ? ?Specimen: Abdominal wall culture ? ?Complications: None ? ?Estimated Blood Loss: 5 mL ? ?

## 2022-02-06 NOTE — Interval H&P Note (Signed)
History and Physical Interval Note: ? ?02/06/2022 ?7:18 AM ? ?Elizabeth Dorsey  has presented today for surgery, with the diagnosis of L02.211 abdominal wall abscess.  The various methods of treatment have been discussed with the patient and family. After consideration of risks, benefits and other options for treatment, the patient has consented to  Procedure(s): ?INCISION AND DRAINAGE ABSCESS (N/A) as a surgical intervention.  The patient's history has been reviewed, patient examined, no change in status, stable for surgery.  I have reviewed the patient's chart and labs.  Questions were answered to the patient's satisfaction.   ? ? ?Elizabeth Dorsey ? ? ?

## 2022-02-06 NOTE — Anesthesia Preprocedure Evaluation (Signed)
Anesthesia Evaluation  ?Patient identified by MRN, date of birth, ID band ?Patient awake ? ? ? ?Reviewed: ?Allergy & Precautions, NPO status , Patient's Chart, lab work & pertinent test results ? ?History of Anesthesia Complications ?Negative for: history of anesthetic complications ? ?Airway ?Mallampati: IV ? ? ?Neck ROM: Full ? ? ? Dental ?no notable dental hx. ? ?  ?Pulmonary ?Current Smoker (1 ppd, quit 1 week ago and now vapes) and Patient abstained from smoking.,  ?  ?Pulmonary exam normal ?breath sounds clear to auscultation ? ? ? ? ? ? Cardiovascular ?Exercise Tolerance: Good ?hypertension, Normal cardiovascular exam ?Rhythm:Regular Rate:Normal ? ? ?  ?Neuro/Psych ?PSYCHIATRIC DISORDERS Depression  Neuromuscular disease (neuropathy)   ? GI/Hepatic ?negative GI ROS,   ?Endo/Other  ?diabetes, Poorly Controlled, Type 2 ? Renal/GU ?negative Renal ROS  ? ?  ?Musculoskeletal ? ? Abdominal ?  ?Peds ? Hematology ?negative hematology ROS ?(+)   ?Anesthesia Other Findings ? ? Reproductive/Obstetrics ? ?  ? ? ? ? ? ? ? ? ? ? ? ? ? ?  ?  ? ? ? ? ? ? ? ? ?Anesthesia Physical ?Anesthesia Plan ? ?ASA: 3 ? ?Anesthesia Plan: General  ? ?Post-op Pain Management:   ? ?Induction: Intravenous ? ?PONV Risk Score and Plan: 2 and Propofol infusion, TIVA, Treatment may vary due to age or medical condition and Ondansetron ? ?Airway Management Planned: Natural Airway ? ?Additional Equipment:  ? ?Intra-op Plan:  ? ?Post-operative Plan:  ? ?Informed Consent: I have reviewed the patients History and Physical, chart, labs and discussed the procedure including the risks, benefits and alternatives for the proposed anesthesia with the patient or authorized representative who has indicated his/her understanding and acceptance.  ? ? ? ? ? ?Plan Discussed with: CRNA ? ?Anesthesia Plan Comments: (LMA/GETA backup discussed.  Patient consented for risks of anesthesia including but not limited to:  ?- adverse  reactions to medications ?- damage to eyes, teeth, lips or other oral mucosa ?- nerve damage due to positioning  ?- sore throat or hoarseness ?- damage to heart, brain, nerves, lungs, other parts of body or loss of life ? ?Informed patient about role of CRNA in peri- and intra-operative care.  Patient voiced understanding.)  ? ? ? ? ? ? ?Anesthesia Quick Evaluation ? ?

## 2022-02-06 NOTE — Discharge Instructions (Addendum)
?  Diet: Resume home heart healthy regular diet.  ? ?Activity:  No activity restrictions.  ? ?Wound care: Remove dressing tomorrow. Once dressing removed, may shower with soapy water and pat dry (do not rub incisions), but no baths or submerging incision underwater until follow-up. (no swimming). Replace new packing as instructed.  ? ?Medications: Resume all home medications. For mild to moderate pain: acetaminophen (Tylenol) or ibuprofen (if no kidney disease). Combining Tylenol with alcohol can substantially increase your risk of causing liver disease. Narcotic pain medications, if prescribed, can be used for severe pain, though may cause nausea, constipation, and drowsiness. If you do not need the narcotic pain medication, you do not need to fill the prescription. ? ?Call office [phone]) at any time if any questions, worsening pain, fevers/chills, bleeding, drainage from incision site, or other concerns. ? ?AMBULATORY SURGERY  ?DISCHARGE INSTRUCTIONS ? ? ?The drugs that you were given will stay in your system until tomorrow so for the next 24 hours you should not: ? ?Drive an automobile ?Make any legal decisions ?Drink any alcoholic beverage ? ? ?You may resume regular meals tomorrow.  Today it is better to start with liquids and gradually work up to solid foods. ? ?You may eat anything you prefer, but it is better to start with liquids, then soup and crackers, and gradually work up to solid foods. ? ? ?Please notify your doctor immediately if you have any unusual bleeding, trouble breathing, redness and pain at the surgery site, drainage, fever, or pain not relieved by medication. ? ? ? ?Additional Instructions: ? ? ?Please contact your physician with any problems or Same Day Surgery at [phone], Monday through Friday 6 am to 4 pm, or [place] at [facility] number at [phone].  ? ? ? ?

## 2022-02-06 NOTE — Transfer of Care (Signed)
Immediate Anesthesia Transfer of Care Note ? ?Patient: Elizabeth Dorsey ? ?Procedure(s) Performed: INCISION AND DRAINAGE ABSCESS (Abdomen) ? ?Patient Location: PACU ? ?Anesthesia Type:General ? ?Level of Consciousness: sedated ? ?Airway & Oxygen Therapy: Patient Spontanous Breathing and Patient connected to face mask oxygen ? ?Post-op Assessment: Report given to RN and Post -op Vital signs reviewed and stable ? ?Post vital signs: Reviewed and stable ? ?Last Vitals:  ?Vitals Value Taken Time  ?BP 89/59   ?Temp    ?Pulse 68 02/06/22 0916  ?Resp 13 02/06/22 0916  ?SpO2 98 % 02/06/22 0916  ?Vitals shown include unvalidated device data. ? ?Last Pain:  ?Vitals:  ? 02/06/22 0700  ?TempSrc: Oral  ?PainSc: 6   ?   ? ?  ? ?Complications: No notable events documented. ?

## 2022-02-07 ENCOUNTER — Encounter: Payer: Self-pay | Admitting: General Surgery

## 2022-02-11 LAB — AEROBIC/ANAEROBIC CULTURE W GRAM STAIN (SURGICAL/DEEP WOUND)

## 2022-02-26 DIAGNOSIS — L02211 Cutaneous abscess of abdominal wall: Secondary | ICD-10-CM | POA: Diagnosis not present

## 2022-03-12 DIAGNOSIS — L02211 Cutaneous abscess of abdominal wall: Secondary | ICD-10-CM | POA: Diagnosis not present

## 2022-04-24 DIAGNOSIS — L02219 Cutaneous abscess of trunk, unspecified: Secondary | ICD-10-CM | POA: Diagnosis not present

## 2022-04-24 DIAGNOSIS — L03319 Cellulitis of trunk, unspecified: Secondary | ICD-10-CM | POA: Diagnosis not present

## 2022-04-24 DIAGNOSIS — E1165 Type 2 diabetes mellitus with hyperglycemia: Secondary | ICD-10-CM | POA: Diagnosis not present

## 2022-04-24 DIAGNOSIS — R739 Hyperglycemia, unspecified: Secondary | ICD-10-CM | POA: Diagnosis not present

## 2022-05-17 ENCOUNTER — Other Ambulatory Visit: Payer: Self-pay | Admitting: Internal Medicine

## 2022-05-17 DIAGNOSIS — Z1231 Encounter for screening mammogram for malignant neoplasm of breast: Secondary | ICD-10-CM

## 2022-05-17 DIAGNOSIS — R0681 Apnea, not elsewhere classified: Secondary | ICD-10-CM | POA: Diagnosis not present

## 2022-05-17 DIAGNOSIS — R0683 Snoring: Secondary | ICD-10-CM | POA: Diagnosis not present

## 2022-05-25 DIAGNOSIS — G4733 Obstructive sleep apnea (adult) (pediatric): Secondary | ICD-10-CM | POA: Diagnosis not present

## 2022-06-13 ENCOUNTER — Ambulatory Visit
Admission: RE | Admit: 2022-06-13 | Discharge: 2022-06-13 | Disposition: A | Discharge status: Home / Self Care | Payer: 59 | Source: Ambulatory Visit | Attending: Internal Medicine | Admitting: Internal Medicine

## 2022-06-13 DIAGNOSIS — Z1231 Encounter for screening mammogram for malignant neoplasm of breast: Secondary | ICD-10-CM | POA: Insufficient documentation

## 2023-08-06 ENCOUNTER — Other Ambulatory Visit: Payer: Self-pay | Admitting: Internal Medicine

## 2023-08-06 DIAGNOSIS — Z1231 Encounter for screening mammogram for malignant neoplasm of breast: Secondary | ICD-10-CM

## 2023-08-21 ENCOUNTER — Ambulatory Visit
Admission: RE | Admit: 2023-08-21 | Discharge: 2023-08-21 | Disposition: A | Discharge status: Home / Self Care | Payer: 59 | Source: Ambulatory Visit | Attending: Internal Medicine | Admitting: Internal Medicine

## 2023-08-21 DIAGNOSIS — Z1231 Encounter for screening mammogram for malignant neoplasm of breast: Secondary | ICD-10-CM | POA: Diagnosis present

## 2023-12-04 ENCOUNTER — Emergency Department: Payer: 59

## 2023-12-04 ENCOUNTER — Emergency Department
Admission: EM | Admit: 2023-12-04 | Discharge: 2023-12-04 | Disposition: A | Discharge status: Home / Self Care | Payer: 59 | Attending: Emergency Medicine | Admitting: Emergency Medicine

## 2023-12-04 ENCOUNTER — Other Ambulatory Visit: Payer: Self-pay

## 2023-12-04 DIAGNOSIS — E162 Hypoglycemia, unspecified: Secondary | ICD-10-CM

## 2023-12-04 DIAGNOSIS — Z1152 Encounter for screening for COVID-19: Secondary | ICD-10-CM | POA: Insufficient documentation

## 2023-12-04 DIAGNOSIS — R197 Diarrhea, unspecified: Secondary | ICD-10-CM | POA: Diagnosis not present

## 2023-12-04 DIAGNOSIS — E11649 Type 2 diabetes mellitus with hypoglycemia without coma: Secondary | ICD-10-CM | POA: Diagnosis not present

## 2023-12-04 DIAGNOSIS — R112 Nausea with vomiting, unspecified: Secondary | ICD-10-CM | POA: Diagnosis not present

## 2023-12-04 LAB — URINALYSIS, ROUTINE W REFLEX MICROSCOPIC
Bacteria, UA: NONE SEEN
Bilirubin Urine: NEGATIVE
Glucose, UA: >=500 mg/dL — AB
Hgb urine dipstick: NEGATIVE
Ketones, ur: NEGATIVE mg/dL
Leukocytes,Ua: NEGATIVE
Nitrite: NEGATIVE
Protein, ur: NEGATIVE mg/dL
Specific Gravity, Urine: 1.01 (ref 1.005–1.030)
pH: 5 (ref 5.0–8.0)

## 2023-12-04 LAB — COMPREHENSIVE METABOLIC PANEL
ALT: 40 U/L (ref 0–44)
AST: 45 U/L — ABNORMAL HIGH (ref 15–41)
Albumin: 3.6 g/dL (ref 3.5–5.0)
Alkaline Phosphatase: 103 U/L (ref 38–126)
Anion gap: 11 (ref 5–15)
BUN: 11 mg/dL (ref 6–20)
CO2: 22 mmol/L (ref 22–32)
Calcium: 8.3 mg/dL — ABNORMAL LOW (ref 8.9–10.3)
Chloride: 102 mmol/L (ref 98–111)
Creatinine, Ser: 0.84 mg/dL (ref 0.44–1.00)
GFR, Estimated: >60 mL/min (ref >60)
Glucose, Bld: 138 mg/dL — ABNORMAL HIGH (ref 70–99)
Potassium: 3.1 mmol/L — ABNORMAL LOW (ref 3.5–5.1)
Sodium: 135 mmol/L (ref 135–145)
Total Bilirubin: 0.3 mg/dL (ref <1.2)
Total Protein: 6.6 g/dL (ref 6.5–8.1)

## 2023-12-04 LAB — CBC WITH DIFFERENTIAL/PLATELET
Abs Immature Granulocytes: 0.08 10*3/uL — ABNORMAL HIGH (ref 0.00–0.07)
Basophils Absolute: 0.1 10*3/uL (ref 0.0–0.1)
Basophils Relative: 1 %
Eosinophils Absolute: 0.6 10*3/uL — ABNORMAL HIGH (ref 0.0–0.5)
Eosinophils Relative: 4 %
HCT: 40.2 % (ref 36.0–46.0)
Hemoglobin: 13.4 g/dL (ref 12.0–15.0)
Immature Granulocytes: 1 %
Lymphocytes Relative: 22 %
Lymphs Abs: 3.2 10*3/uL (ref 0.7–4.0)
MCH: 31.3 pg (ref 26.0–34.0)
MCHC: 33.3 g/dL (ref 30.0–36.0)
MCV: 93.9 fL (ref 80.0–100.0)
Monocytes Absolute: 1 10*3/uL (ref 0.1–1.0)
Monocytes Relative: 7 %
Neutro Abs: 9.8 10*3/uL — ABNORMAL HIGH (ref 1.7–7.7)
Neutrophils Relative %: 65 %
Platelets: 439 10*3/uL — ABNORMAL HIGH (ref 150–400)
RBC: 4.28 MIL/uL (ref 3.87–5.11)
RDW: 12.8 % (ref 11.5–15.5)
WBC: 14.7 10*3/uL — ABNORMAL HIGH (ref 4.0–10.5)
nRBC: 0 % (ref 0.0–0.2)

## 2023-12-04 LAB — CBG MONITORING, ED
Glucose-Capillary: 145 mg/dL — ABNORMAL HIGH (ref 70–99)
Glucose-Capillary: 77 mg/dL (ref 70–99)
Glucose-Capillary: 86 mg/dL (ref 70–99)
Glucose-Capillary: 172 mg/dL — ABNORMAL HIGH (ref 70–99)
Glucose-Capillary: 211 mg/dL — ABNORMAL HIGH (ref 70–99)
Glucose-Capillary: 328 mg/dL — ABNORMAL HIGH (ref 70–99)
Glucose-Capillary: 349 mg/dL — ABNORMAL HIGH (ref 70–99)

## 2023-12-04 LAB — RESP PANEL BY RT-PCR (RSV, FLU A&B, COVID)  RVPGX2
Influenza A by PCR: NEGATIVE
Influenza B by PCR: NEGATIVE
Resp Syncytial Virus by PCR: NEGATIVE
SARS Coronavirus 2 by RT PCR: NEGATIVE

## 2023-12-04 MED ORDER — METOCLOPRAMIDE HCL 5 MG/ML IJ SOLN
10.0000 mg | Freq: Once | INTRAMUSCULAR | Status: AC
  Administered 2023-12-04: 10 mg via INTRAVENOUS
  Filled 2023-12-04: qty 2

## 2023-12-04 MED ORDER — ONDANSETRON HCL 4 MG/2ML IJ SOLN
4.0000 mg | Freq: Once | INTRAMUSCULAR | Status: AC
  Administered 2023-12-04: 4 mg via INTRAVENOUS
  Filled 2023-12-04: qty 2

## 2023-12-04 MED ORDER — ALUM & MAG HYDROXIDE-SIMETH 200-200-20 MG/5ML PO SUSP
30.0000 mL | Freq: Once | ORAL | Status: AC
  Administered 2023-12-04: 30 mL via ORAL
  Filled 2023-12-04: qty 30

## 2023-12-04 MED ORDER — FAMOTIDINE IN NACL 20-0.9 MG/50ML-% IV SOLN
20.0000 mg | Freq: Once | INTRAVENOUS | Status: AC
  Administered 2023-12-04: 20 mg via INTRAVENOUS
  Filled 2023-12-04: qty 50

## 2023-12-04 MED ORDER — LOPERAMIDE HCL 2 MG PO CAPS
4.0000 mg | ORAL_CAPSULE | Freq: Once | ORAL | Status: AC
  Administered 2023-12-04: 4 mg via ORAL
  Filled 2023-12-04: qty 2

## 2023-12-04 MED ORDER — LOPERAMIDE HCL 2 MG PO TABS: 2.0000 mg | ORAL_TABLET | Freq: Four times a day (QID) | ORAL | 0 refills | Status: AC | PRN

## 2023-12-04 MED ORDER — ONDANSETRON 8 MG PO TBDP: 8.0000 mg | ORAL_TABLET | Freq: Three times a day (TID) | ORAL | 0 refills | Status: AC | PRN

## 2023-12-04 MED ORDER — IOHEXOL 300 MG/ML  SOLN
100.0000 mL | Freq: Once | INTRAMUSCULAR | Status: AC | PRN
  Administered 2023-12-04: 100 mL via INTRAVENOUS

## 2023-12-04 MED ORDER — METFORMIN HCL 500 MG PO TABS
500.0000 mg | ORAL_TABLET | Freq: Once | ORAL | Status: DC
  Filled 2023-12-04: qty 1

## 2023-12-04 MED ORDER — LACTATED RINGERS IV BOLUS
1000.0000 mL | Freq: Once | INTRAVENOUS | Status: AC
  Administered 2023-12-04: 1000 mL via INTRAVENOUS

## 2023-12-04 MED ORDER — INSULIN ASPART 100 UNIT/ML IJ SOLN
10.0000 [IU] | Freq: Once | INTRAMUSCULAR | Status: AC
  Administered 2023-12-04: 10 [IU] via INTRAVENOUS
  Filled 2023-12-04: qty 1

## 2023-12-04 NOTE — ED Notes (Signed)
Pt denies ABD pain but has nausea and "general unwell feeling"

## 2023-12-04 NOTE — ED Triage Notes (Signed)
Pt arrives by EMS from home for hypoglycemia. EMS arrived and glucometer read "low" EMS administered D10 and sugar improved. Pt was A/ox4 and was eating food then refused transport. EMS was called again for the same. Sugar had dropped in the 50s. EMS administered another D10 with improvement to sugar. Pt presents to the ER A/ox4, shivering. Vitals stable. Blood sugar 145.

## 2023-12-04 NOTE — ED Notes (Signed)
This RN assisted pt to the bathroom - pt had another episode of diarrhea.

## 2023-12-04 NOTE — ED Provider Notes (Signed)
[facility] Provider Note    Event Date/Time   First MD Initiated Contact with Patient 12/04/23 [phone]     (approximate)   History   Hypoglycemia   HPI  Elizabeth Dorsey is a 52 y.o. female who presents to the ED for evaluation of Hypoglycemia   I review an endocrine clinic visit from 10 days ago.  History of diabetes and labile blood glucoses.  Jardiance, insulin, metformin  Patient presents to the ED from home via EMS for evaluation of hypoglycemia.  EMS reports 2 call outs.  First was for tremors, she was found unconscious, tremulous and had a "low" blood glucose resolved with IV D10.  Subsequently tolerating p.o. intake at home and refused transport.  EMS reports a second call out 10-15 minutes later for the same thing, found her with glucose in the 50s or 60s, more IV D10.  On arrival to the ED, patient reports feeling cold.  She reports about 1 day of N/V/D.  No abdominal pain.  No dysuria.  No fevers.  Adamantly denies any suicidal ideations or overdose  Physical Exam   Triage Vital Signs: ED Triage Vitals  Encounter Vitals Group     BP      Systolic BP Percentile      Diastolic BP Percentile      Pulse      Resp      Temp      Temp src      SpO2      Weight      Height      Head Circumference      Peak Flow      Pain Score      Pain Loc      Pain Education      Exclude from Growth Chart     Most recent vital signs: Vitals:   12/04/23 0530 12/04/23 0630  BP: 135/84 127/77  Pulse: 83 90  Resp: 18 16  Temp:    SpO2: 96% 99%    General: Awake, no distress.  Shivering under multiple blankets.  Answers questions. CV:  Good peripheral perfusion.  Resp:  Normal effort.  Abd:  No distention.  Soft and benign MSK:  No deformity noted.  Neuro:  No focal deficits appreciated. Other:     ED Results / Procedures / Treatments   Labs (all labs ordered are listed, but only abnormal results are displayed) Labs Reviewed   URINALYSIS, ROUTINE W REFLEX MICROSCOPIC - Abnormal; Notable for the following components:      Result Value   Color, Urine STRAW (*)    APPearance CLEAR (*)    Glucose, UA >=500 (*)    All other components within normal limits  COMPREHENSIVE METABOLIC PANEL - Abnormal; Notable for the following components:   Potassium 3.1 (*)    Glucose, Bld 138 (*)    Calcium 8.3 (*)    AST 45 (*)    All other components within normal limits  CBC WITH DIFFERENTIAL/PLATELET - Abnormal; Notable for the following components:   WBC 14.7 (*)    Platelets 439 (*)    Neutro Abs 9.8 (*)    Eosinophils Absolute 0.6 (*)    Abs Immature Granulocytes 0.08 (*)    All other components within normal limits  CBG MONITORING, ED - Abnormal; Notable for the following components:   Glucose-Capillary 145 (*)    All other components within normal limits  CBG MONITORING, ED - Abnormal; Notable for  the following components:   Glucose-Capillary 172 (*)    All other components within normal limits  CBG MONITORING, ED - Abnormal; Notable for the following components:   Glucose-Capillary 211 (*)    All other components within normal limits  RESP PANEL BY RT-PCR (RSV, FLU A&B, COVID)  RVPGX2  CBG MONITORING, ED  CBG MONITORING, ED    EKG   RADIOLOGY CXR interpreted by me without evidence of acute cardiopulmonary pathology.  Official radiology report(s): DG Chest Port 1 View Result Date: 12/04/2023 CLINICAL DATA:  [id number] with hypoglycemia, nausea, vomiting and malaise. EXAM: PORTABLE CHEST 1 VIEW COMPARISON:  PA and lateral chest 04/02/2011 FINDINGS: The heart size and mediastinal contours are within normal limits. Both lungs are clear. The visualized skeletal structures are unremarkable. IMPRESSION: No active disease.  Stable chest. Electronically Signed   By: Almira Bar M.D.   On: 12/04/2023 06:26    PROCEDURES and INTERVENTIONS:  Procedures  Medications  alum & mag hydroxide-simeth (MAALOX/MYLANTA)  200-200-20 MG/5ML suspension 30 mL (has no administration in time range)  famotidine (PEPCID) IVPB 20 mg premix (has no administration in time range)  metoCLOPramide (REGLAN) injection 10 mg (has no administration in time range)  iohexol (OMNIPAQUE) 300 MG/ML solution 100 mL (has no administration in time range)  ondansetron (ZOFRAN) injection 4 mg (4 mg Intravenous Given 12/04/23 0558)  lactated ringers bolus 1,000 mL (1,000 mLs Intravenous New Bag/Given 12/04/23 0703)     IMPRESSION / MDM / ASSESSMENT AND PLAN / ED COURSE  I reviewed the triage vital signs and the nursing notes.  Differential diagnosis includes, but is not limited to, UTI, sepsis, overdose, viral illness, gastroenteritis, pancreatitis  {Patient presents with symptoms of an acute illness or injury that is potentially life-threatening.  Patient presents with recurrent hypoglycemia in the setting of GI illness.  No abdominal pain on arrival, but develops epigastric pain in the ED.  Pending CT scan around the time of signout.  No hypoglycemia in the ED to require IV dextrose, but does dip into the 80s, subsequently resolved with oral intake.  Urine is clear, leukocytosis is noted and nonspecific.  No clear infectious etiology of her symptoms.  Clear CXR.  Negative viral swabs and normal electrolytes.  Pending CT.  If she is able tolerate p.o. intake and recurrent CBGs are normal without need from further dextrose, she may be suitable for outpatient management.  Clinical Course as of 12/04/23 0731  Thu Dec 04, 2023  0604 Reassessed.  Discussed plan of care and workup so far. [DS]  4166 CBG improving to the 170s with oral intake.  Has not required IV dextrose [DS]  0726 Reassessed.  She is now reporting some epigastric abdominal burning sensation along her nausea.  We will add medication for presumed GERD and obtain a CT scan considering her GI symptoms and pain.  Signed out to morning physician [DS]    Clinical Course User  Index [DS] Delton Prairie, MD     FINAL CLINICAL IMPRESSION(S) / ED DIAGNOSES   Final diagnoses:  Hypoglycemia  Nausea vomiting and diarrhea     Rx / DC Orders   ED Discharge Orders     None        Note:  This document was prepared using Dragon voice recognition software and may include unintentional dictation errors.   Delton Prairie, MD 12/04/23 [phone]

## 2023-12-04 NOTE — ED Notes (Signed)
Pt shakey and not feeling well. Feeling dizzy and lightheaded but stating she needed to use the restroom immediately. This RN assisted pt safely to the bathroom. Pt had diarrhea. Pt walked with steady gait with assistance back to the bed.

## 2023-12-04 NOTE — ED Notes (Signed)
Pt assisted to bathroom. Had diarrhea episode, dry heaves, and urinated.

## 2023-12-04 NOTE — Inpatient Diabetes Management (Signed)
Inpatient Diabetes Program Recommendations  AACE/ADA: New Consensus Statement on Inpatient Glycemic Control   Target Ranges:  Prepandial:   less than 140 mg/dL      Peak postprandial:   less than 180 mg/dL (1-2 hours)      Critically ill patients:  140 - 180 mg/dL    Latest Reference Range & Units 12/04/23 04:33 12/04/23 05:36 12/04/23 06:06 12/04/23 06:34 12/04/23 07:29 12/04/23 09:17  Glucose-Capillary 70 - 99 mg/dL 409 (H) 86 77 811 (H) 914 (H) 328 (H)   Review of Glycemic Control  Diabetes history: DM2 Outpatient Diabetes medications: Tresiba 60 units at bedtime, Metformin 500 mg BID, Novolog 20 units TID with meals Current orders for Inpatient glycemic control: None; CBGs  Inpatient Diabetes Program Recommendations:    Insulin: Current CBG 328 mg/dl at 7:82 am today. Please consider ordering CBGs Q4H and Novolog 0-15 units Q4H.   NOTE: Patient is currently in ED with hypoglycemia (brought in via EMS). Current CBG 328 mg/dl at 9:56 am today.  In reviewing chart, noted patient sees Dr. Tedd Sias (Endocrinologist) and was last seen 11/24/23. Per office note on 11/24/23, patient take first dose of Trulicity 1.5 mg on 11/18/23 and developed nausea and vomiting on 11/20/23. As a result patient was advised to stop Trulicity, continue Metformin 2000 mg daily, Do not take Jardiance daily (restart on 12/01/23), adjust Basaglar (if bedtime CBG over 100 mg/dl take 70 units; if bedtime CBG less than 100 mg/dl take 35 units), and no Novolog until able to resume solid meals.  Thanks, Orlando Penner, RN, MSN, CDCES Diabetes Coordinator Inpatient Diabetes Program [phone] (Team Pager from 8am to 5pm)

## 2024-04-20 ENCOUNTER — Encounter: Payer: Self-pay | Admitting: Emergency Medicine

## 2024-04-20 ENCOUNTER — Emergency Department
Admission: EM | Admit: 2024-04-20 | Discharge: 2024-04-20 | Disposition: A | Discharge status: Home / Self Care | Attending: Emergency Medicine | Admitting: Emergency Medicine

## 2024-04-20 ENCOUNTER — Other Ambulatory Visit: Payer: Self-pay

## 2024-04-20 DIAGNOSIS — F411 Generalized anxiety disorder: Secondary | ICD-10-CM | POA: Diagnosis not present

## 2024-04-20 DIAGNOSIS — F410 Panic disorder [episodic paroxysmal anxiety] without agoraphobia: Secondary | ICD-10-CM | POA: Diagnosis present

## 2024-04-20 DIAGNOSIS — E109 Type 1 diabetes mellitus without complications: Secondary | ICD-10-CM | POA: Insufficient documentation

## 2024-04-20 HISTORY — DX: Unspecified convulsions: R56.9

## 2024-04-20 LAB — COMPREHENSIVE METABOLIC PANEL WITH GFR
ALT: 15 U/L (ref 0–44)
AST: 17 U/L (ref 15–41)
Albumin: 4 g/dL (ref 3.5–5.0)
Alkaline Phosphatase: 111 U/L (ref 38–126)
Anion gap: 11 (ref 5–15)
BUN: 17 mg/dL (ref 6–20)
CO2: 21 mmol/L — ABNORMAL LOW (ref 22–32)
Calcium: 9.7 mg/dL (ref 8.9–10.3)
Chloride: 105 mmol/L (ref 98–111)
Creatinine, Ser: 0.95 mg/dL (ref 0.44–1.00)
GFR, Estimated: >60 mL/min (ref >60)
Glucose, Bld: 182 mg/dL — ABNORMAL HIGH (ref 70–99)
Potassium: 4.2 mmol/L (ref 3.5–5.1)
Sodium: 137 mmol/L (ref 135–145)
Total Bilirubin: 0.8 mg/dL (ref 0.0–1.2)
Total Protein: 7.7 g/dL (ref 6.5–8.1)

## 2024-04-20 LAB — URINE DRUG SCREEN, QUALITATIVE (ARMC ONLY)
Amphetamines, Ur Screen: NOT DETECTED
Barbiturates, Ur Screen: NOT DETECTED
Benzodiazepine, Ur Scrn: NOT DETECTED
Cannabinoid 50 Ng, Ur ~~LOC~~: POSITIVE — AB
Cocaine Metabolite,Ur ~~LOC~~: NOT DETECTED
MDMA (Ecstasy)Ur Screen: NOT DETECTED
Methadone Scn, Ur: NOT DETECTED
Opiate, Ur Screen: NOT DETECTED
Phencyclidine (PCP) Ur S: NOT DETECTED
Tricyclic, Ur Screen: NOT DETECTED

## 2024-04-20 LAB — CBC
HCT: 45.2 % (ref 36.0–46.0)
Hemoglobin: 15 g/dL (ref 12.0–15.0)
MCH: 30.4 pg (ref 26.0–34.0)
MCHC: 33.2 g/dL (ref 30.0–36.0)
MCV: 91.5 fL (ref 80.0–100.0)
Platelets: 465 10*3/uL — ABNORMAL HIGH (ref 150–400)
RBC: 4.94 MIL/uL (ref 3.87–5.11)
RDW: 13.2 % (ref 11.5–15.5)
WBC: 10.7 10*3/uL — ABNORMAL HIGH (ref 4.0–10.5)
nRBC: 0 % (ref 0.0–0.2)

## 2024-04-20 LAB — TSH: TSH: 1.046 u[IU]/mL (ref 0.350–4.500)

## 2024-04-20 LAB — ETHANOL: Alcohol, Ethyl (B): <15 mg/dL (ref <15)

## 2024-04-20 LAB — POC URINE PREG, ED: Preg Test, Ur: NEGATIVE

## 2024-04-20 MED ORDER — ALPRAZOLAM 0.5 MG PO TABS
0.5000 mg | ORAL_TABLET | ORAL | Status: AC
  Administered 2024-04-20: 0.5 mg via ORAL
  Filled 2024-04-20: qty 1

## 2024-04-20 NOTE — ED Notes (Signed)
 Urine supplies given to Pt for sample and explained why. Moved to secluded hallway d/t panic attack d/t noise volume in the ER.

## 2024-04-20 NOTE — ED Provider Notes (Signed)
 [facility] Provider Note    Event Date/Time   First MD Initiated Contact with Patient 04/20/24 1018     (approximate)   History   Panic Attack   HPI  Elizabeth Dorsey is a 53 y.o. female with a history of insulin  controlled diabetes  She and her partner both relate that since having a seizure-like episode where she had extreme low blood sugar several months ago that she has had an increased level of anxiety.  She has seen her doctor for it and it became particularly bad over the last several days to the point that she feels shaky, difficult for her to want to get up to do things, she feels paralyzed by feeling of anxiety.  She cannot identify anyone reason.  She has suffered from depression in the past.  She does not use any illicit substances, does not use alcohol.  She has not had any headache numbness weakness chest pain or other symptoms but she just feels severely anxious  She saw her doctor yesterday, unsure was given a prescription for alprazolam but has not yet filled it  She came to the ER today, she called her psychiatrist and they advised they did not have an appointment to see her and recommended that she should come to the ER  She and her partner are both very clear that she has no thoughts of hurting herself, no suicidal ideations.  She is not confused     Physical Exam   Triage Vital Signs: ED Triage Vitals [04/20/24 1006]  Encounter Vitals Group     BP 115/86     Systolic BP Percentile      Diastolic BP Percentile      Pulse Rate 96     Resp 17     Temp 98 F (36.7 C)     Temp Source Oral     SpO2 97 %     Weight 177 lb (80.3 kg)     Height 5\' 6"  (1.676 m)     Head Circumference      Peak Flow      Pain Score 0     Pain Loc      Pain Education      Exclude from Growth Chart     Most recent vital signs: Vitals:   04/20/24 1006  BP: 115/86  Pulse: 96  Resp: 17  Temp: 98 F (36.7 C)  SpO2: 97%      General: Awake, no distress.  She appears quite anxious though, she reports she feels very anxious it is very difficult for her to be in the ER environment. CV:  Good peripheral perfusion.  Normal tones and rate Resp:  Normal effort.  Clear bilateral Abd:  No distention.  Soft nontender nondistended Other:  Slightly tremulous in both hands.  Moves all extremities well extraocular movements are normal.  Her speech is clear.  She has no gross abnormality of her cranial nerves.  She is fully alert and oriented conversant, but appears anxious   ED Results / Procedures / Treatments   Labs (all labs ordered are listed, but only abnormal results are displayed) Labs Reviewed  COMPREHENSIVE METABOLIC PANEL WITH GFR - Abnormal; Notable for the following components:      Result Value   CO2 21 (*)    Glucose, Bld 182 (*)    All other components within normal limits  CBC - Abnormal; Notable for the following components:   WBC 10.7 (*)  Platelets 465 (*)    All other components within normal limits  URINE DRUG SCREEN, QUALITATIVE ([facility] ONLY) - Abnormal; Notable for the following components:   Cannabinoid 50 Ng, Ur Guy POSITIVE (*)    All other components within normal limits  POC URINE PREG, ED - Normal  ETHANOL  TSH     EKG     RADIOLOGY     PROCEDURES:  Critical Care performed: No  Procedures   MEDICATIONS ORDERED IN ED: Medications  ALPRAZolam (XANAX) tablet 0.5 mg (0.5 mg Oral Given 04/20/24 1129)     IMPRESSION / MDM / ASSESSMENT AND PLAN / ED COURSE  I reviewed the triage vital signs and the nursing notes.                              Differential diagnosis includes, but is not limited to, possible anxiety state, psychiatric causation, less likely felt to be an obvious acute metabolic or organic neurologic type cause.  She has no cardiopulmonary symptoms.  She has no infectious symptoms no headaches she is awake alert well-oriented.  Her labs are very  reassuring with very slight leukocytosis of nonspecific nature.  Glucose appropriate.  She does not report any use of any illicit substances or alcohol or substances that would cause acute withdrawal.    Patient's presentation is most consistent with acute complicated illness / injury requiring diagnostic workup.   I have a low suspicion of this would be representative thyroid disease.  Her anterior neck is nontender I do not feel any nodules.  Her hemodynamics are appropriate  ----------------------------------------- 11:48 AM on 04/20/2024 ----------------------------------------- Patient was pending consult with psychiatry on a voluntary basis.  She has elected to discontinue and follow-up with her own psychiatrist.  She was actually able to secure an appointment with her psychiatrist later today and instead would prefer to see her own doctor today instead of awaiting evaluation by psychiatrist here.  I think this is quite reasonable.  I did tell her that her thyroid testing is still pending, and we have not done any sort of scans of her brain such as a CT or MRI, and she is understanding of this.  I had originally offered that we should check her thyroid testing consider potentially doing a CT of the head given she relates some proximity to the symptoms starting after she had an episode of severe hypoglycemia in the distant past  Given the patient's symptoms, she is awake alert, voluntary, and that her partner is here and will be driving her, I think it is appropriate for her to be discharged as she has secured consultation and follow-up with her psychiatrist  Return precautions and treatment recommendations and follow-up discussed with the patient who is agreeable with the plan.        FINAL CLINICAL IMPRESSION(S) / ED DIAGNOSES   Final diagnoses:  Anxiety state     Rx / DC Orders   ED Discharge Orders     None        Note:  This document was prepared using Dragon voice  recognition software and may include unintentional dictation errors.   Iver Marker, MD 04/20/24 1150

## 2024-04-20 NOTE — Discharge Instructions (Signed)
 Please follow-up with the appointment you have scheduled for this afternoon with your psychiatrist.  No driving today as you were given alprazolam.  Please follow-up closely with both your psychiatrist as well as your primary care doctor.  Return to the ER right away if your symptoms become severe, disabling, you have any numbness, weakness, bad headache, or other concerns or symptoms arise

## 2024-04-20 NOTE — ED Notes (Signed)
 Per EDP, Pt ok to stay in clothing until they meet w/ TTS/psych

## 2024-04-20 NOTE — ED Triage Notes (Signed)
 Patient to ED via POV for panic attack. PT states worsening since Friday. Started taking new anxiety medicine yesterday- not helping. Had seizure in Dec and worsening since then. PT reports feeling scared and crying in triage. Pt crying stating "I just need it to stop" and stating she "needs relief." Has been unable to work due to same.

## 2025-01-06 ENCOUNTER — Other Ambulatory Visit: Payer: Self-pay | Admitting: Internal Medicine

## 2025-01-06 DIAGNOSIS — R42 Dizziness and giddiness: Secondary | ICD-10-CM

## 2025-01-11 ENCOUNTER — Ambulatory Visit: Admission: RE | Admit: 2025-01-11 | Discharge: 2025-01-11 | Attending: Internal Medicine | Admitting: Internal Medicine

## 2025-01-11 ENCOUNTER — Encounter: Payer: Self-pay | Admitting: Radiology

## 2025-01-11 DIAGNOSIS — R42 Dizziness and giddiness: Secondary | ICD-10-CM
